# Patient Record
Sex: Male | Born: 1935 | Race: White | Hispanic: No | Marital: Single | Smoking: Former smoker
Health system: Southern US, Community
[De-identification: ages and names within clinical notes are randomized; demographics above are authoritative.]

## PROBLEM LIST (undated history)

## (undated) DIAGNOSIS — M1990 Unspecified osteoarthritis, unspecified site: Secondary | ICD-10-CM

## (undated) DIAGNOSIS — E785 Hyperlipidemia, unspecified: Secondary | ICD-10-CM

## (undated) DIAGNOSIS — E039 Hypothyroidism, unspecified: Secondary | ICD-10-CM

## (undated) DIAGNOSIS — R42 Dizziness and giddiness: Secondary | ICD-10-CM

## (undated) DIAGNOSIS — F0390 Unspecified dementia without behavioral disturbance: Secondary | ICD-10-CM

## (undated) DIAGNOSIS — E079 Disorder of thyroid, unspecified: Secondary | ICD-10-CM

## (undated) HISTORY — PX: APPENDECTOMY: SHX54

---

## 1998-08-15 ENCOUNTER — Encounter: Payer: Self-pay | Admitting: Emergency Medicine

## 1998-08-15 ENCOUNTER — Inpatient Hospital Stay (HOSPITAL_COMMUNITY): Admission: EM | Admit: 1998-08-15 | Discharge: 1998-08-16 | Payer: Self-pay | Admitting: Emergency Medicine

## 1998-08-15 ENCOUNTER — Encounter: Payer: Self-pay | Admitting: Surgery

## 1998-09-07 ENCOUNTER — Observation Stay (HOSPITAL_COMMUNITY): Admission: RE | Admit: 1998-09-07 | Discharge: 1998-09-08 | Payer: Self-pay | Admitting: Surgery

## 1999-08-29 ENCOUNTER — Emergency Department (HOSPITAL_COMMUNITY): Admission: EM | Admit: 1999-08-29 | Discharge: 1999-08-29 | Payer: Self-pay | Admitting: Emergency Medicine

## 1999-08-29 ENCOUNTER — Encounter: Payer: Self-pay | Admitting: Emergency Medicine

## 1999-10-07 ENCOUNTER — Emergency Department (HOSPITAL_COMMUNITY): Admission: EM | Admit: 1999-10-07 | Discharge: 1999-10-08 | Payer: Self-pay | Admitting: Emergency Medicine

## 2000-04-20 ENCOUNTER — Emergency Department (HOSPITAL_COMMUNITY): Admission: EM | Admit: 2000-04-20 | Discharge: 2000-04-20 | Payer: Self-pay | Admitting: Emergency Medicine

## 2000-04-25 ENCOUNTER — Emergency Department (HOSPITAL_COMMUNITY): Admission: EM | Admit: 2000-04-25 | Discharge: 2000-04-25 | Payer: Self-pay | Admitting: Emergency Medicine

## 2000-04-26 ENCOUNTER — Encounter: Payer: Self-pay | Admitting: Emergency Medicine

## 2000-05-05 ENCOUNTER — Emergency Department (HOSPITAL_COMMUNITY): Admission: EM | Admit: 2000-05-05 | Discharge: 2000-05-05 | Payer: Self-pay | Admitting: Emergency Medicine

## 2000-12-29 ENCOUNTER — Encounter: Payer: Self-pay | Admitting: Emergency Medicine

## 2000-12-29 ENCOUNTER — Emergency Department (HOSPITAL_COMMUNITY): Admission: EM | Admit: 2000-12-29 | Discharge: 2000-12-29 | Payer: Self-pay | Admitting: Emergency Medicine

## 2001-05-12 ENCOUNTER — Emergency Department (HOSPITAL_COMMUNITY): Admission: EM | Admit: 2001-05-12 | Discharge: 2001-05-12 | Payer: Self-pay | Admitting: Emergency Medicine

## 2001-11-08 ENCOUNTER — Encounter: Payer: Self-pay | Admitting: Nephrology

## 2001-11-08 ENCOUNTER — Ambulatory Visit (HOSPITAL_COMMUNITY): Admission: RE | Admit: 2001-11-08 | Discharge: 2001-11-08 | Payer: Self-pay | Admitting: Nephrology

## 2002-02-22 ENCOUNTER — Emergency Department (HOSPITAL_COMMUNITY): Admission: EM | Admit: 2002-02-22 | Discharge: 2002-02-22 | Payer: Self-pay | Admitting: Emergency Medicine

## 2002-10-11 ENCOUNTER — Emergency Department (HOSPITAL_COMMUNITY): Admission: EM | Admit: 2002-10-11 | Discharge: 2002-10-12 | Payer: Self-pay | Admitting: Emergency Medicine

## 2002-10-11 ENCOUNTER — Encounter: Payer: Self-pay | Admitting: Emergency Medicine

## 2002-11-05 ENCOUNTER — Emergency Department (HOSPITAL_COMMUNITY): Admission: EM | Admit: 2002-11-05 | Discharge: 2002-11-05 | Payer: Self-pay | Admitting: Emergency Medicine

## 2002-11-05 ENCOUNTER — Encounter: Payer: Self-pay | Admitting: Emergency Medicine

## 2003-10-03 ENCOUNTER — Emergency Department (HOSPITAL_COMMUNITY): Admission: EM | Admit: 2003-10-03 | Discharge: 2003-10-03 | Payer: Self-pay | Admitting: Emergency Medicine

## 2005-03-30 ENCOUNTER — Emergency Department (HOSPITAL_COMMUNITY): Admission: EM | Admit: 2005-03-30 | Discharge: 2005-03-31 | Payer: Self-pay | Admitting: Family Medicine

## 2007-04-30 ENCOUNTER — Emergency Department (HOSPITAL_COMMUNITY): Admission: EM | Admit: 2007-04-30 | Discharge: 2007-04-30 | Payer: Self-pay | Admitting: Emergency Medicine

## 2009-10-24 ENCOUNTER — Ambulatory Visit: Payer: Self-pay | Admitting: Diagnostic Radiology

## 2009-10-24 ENCOUNTER — Emergency Department (HOSPITAL_BASED_OUTPATIENT_CLINIC_OR_DEPARTMENT_OTHER): Admission: EM | Admit: 2009-10-24 | Discharge: 2009-10-24 | Payer: Self-pay | Admitting: Emergency Medicine

## 2010-07-06 ENCOUNTER — Inpatient Hospital Stay (HOSPITAL_COMMUNITY)
Admission: EM | Admit: 2010-07-06 | Discharge: 2010-07-12 | Payer: Self-pay | Source: Home / Self Care | Admitting: Emergency Medicine

## 2010-07-06 ENCOUNTER — Ambulatory Visit: Payer: Self-pay | Admitting: Cardiovascular Disease

## 2010-07-07 ENCOUNTER — Encounter (INDEPENDENT_AMBULATORY_CARE_PROVIDER_SITE_OTHER): Payer: Self-pay | Admitting: Family Medicine

## 2010-07-07 ENCOUNTER — Ambulatory Visit: Payer: Self-pay | Admitting: Vascular Surgery

## 2010-08-21 ENCOUNTER — Ambulatory Visit: Payer: Self-pay | Admitting: Psychiatry

## 2010-12-15 LAB — DIFFERENTIAL
Basophils Absolute: 0 10*3/uL (ref 0.0–0.1)
Eosinophils Relative: 1 % (ref 0–5)
Eosinophils Relative: 2 % (ref 0–5)
Lymphocytes Relative: 20 % (ref 12–46)
Lymphs Abs: 1.7 10*3/uL (ref 0.7–4.0)
Monocytes Absolute: 1 10*3/uL (ref 0.1–1.0)
Monocytes Relative: 12 % (ref 3–12)
Monocytes Relative: 14 % — ABNORMAL HIGH (ref 3–12)
Neutrophils Relative %: 75 % (ref 43–77)

## 2010-12-15 LAB — URINE CULTURE
Colony Count: >=100000
Special Requests: NEGATIVE

## 2010-12-15 LAB — POCT I-STAT, CHEM 8
BUN: 16 mg/dL (ref 6–23)
Calcium, Ion: 1.19 mmol/L (ref 1.12–1.32)
Chloride: 103 mEq/L (ref 96–112)
Creatinine, Ser: 1 mg/dL (ref 0.4–1.5)
Glucose, Bld: 119 mg/dL — ABNORMAL HIGH (ref 70–99)
HCT: 35 % — ABNORMAL LOW (ref 39.0–52.0)
Potassium: 3.8 mEq/L (ref 3.5–5.1)

## 2010-12-15 LAB — CARDIAC PANEL(CRET KIN+CKTOT+MB+TROPI)
CK, MB: 2.4 ng/mL (ref 0.3–4.0)
CK, MB: 2.4 ng/mL (ref 0.3–4.0)
Relative Index: 2 (ref 0.0–2.5)

## 2010-12-15 LAB — POCT CARDIAC MARKERS
CKMB, poc: <1 ng/mL — ABNORMAL LOW (ref 1.0–8.0)
Myoglobin, poc: 64.4 ng/mL (ref 12–200)

## 2010-12-15 LAB — URINALYSIS, MICROSCOPIC ONLY
Glucose, UA: NEGATIVE mg/dL
Leukocytes, UA: NEGATIVE
Nitrite: NEGATIVE
Protein, ur: NEGATIVE mg/dL
pH: 6 (ref 5.0–8.0)

## 2010-12-15 LAB — CBC
HCT: 32.6 % — ABNORMAL LOW (ref 39.0–52.0)
Hemoglobin: 11.1 g/dL — ABNORMAL LOW (ref 13.0–17.0)
MCH: 29.1 pg (ref 26.0–34.0)
MCH: 29.1 pg (ref 26.0–34.0)
MCHC: 34 g/dL (ref 30.0–36.0)
MCHC: 34.2 g/dL (ref 30.0–36.0)
MCV: 85.3 fL (ref 78.0–100.0)
Platelets: 160 10*3/uL (ref 150–400)
WBC: 8.6 10*3/uL (ref 4.0–10.5)

## 2010-12-15 LAB — TROPONIN I: Troponin I: 0.02 ng/mL (ref 0.00–0.06)

## 2010-12-15 LAB — BASIC METABOLIC PANEL
CO2: 25 mEq/L (ref 19–32)
Chloride: 109 mEq/L (ref 96–112)
Glucose, Bld: 95 mg/dL (ref 70–99)
Sodium: 140 mEq/L (ref 135–145)

## 2010-12-20 LAB — CBC
MCHC: 34 g/dL (ref 30.0–36.0)
MCV: 79.2 fL (ref 78.0–100.0)
Platelets: 528 10*3/uL — ABNORMAL HIGH (ref 150–400)
RBC: 3.97 MIL/uL — ABNORMAL LOW (ref 4.22–5.81)
WBC: 6.1 10*3/uL (ref 4.0–10.5)

## 2010-12-20 LAB — POCT B-TYPE NATRIURETIC PEPTIDE (BNP): B Natriuretic Peptide, POC: 76.6 pg/mL (ref 0–100)

## 2010-12-20 LAB — BASIC METABOLIC PANEL
CO2: 27 mEq/L (ref 19–32)
Calcium: 8.9 mg/dL (ref 8.4–10.5)
Creatinine, Ser: 0.9 mg/dL (ref 0.4–1.5)
GFR calc non Af Amer: >60 mL/min (ref >60)
Glucose, Bld: 99 mg/dL (ref 70–99)

## 2010-12-20 LAB — DIFFERENTIAL
Basophils Relative: 1 % (ref 0–1)
Lymphs Abs: 2 10*3/uL (ref 0.7–4.0)
Monocytes Relative: 12 % (ref 3–12)
Neutro Abs: 3.3 10*3/uL (ref 1.7–7.7)
Neutrophils Relative %: 54 % (ref 43–77)

## 2010-12-20 LAB — PROTIME-INR: INR: 1 (ref 0.00–1.49)

## 2010-12-20 LAB — URINALYSIS, ROUTINE W REFLEX MICROSCOPIC
Glucose, UA: NEGATIVE mg/dL
Hgb urine dipstick: NEGATIVE
Protein, ur: NEGATIVE mg/dL

## 2010-12-20 LAB — LIPASE, BLOOD: Lipase: 71 U/L (ref 23–300)

## 2011-05-02 ENCOUNTER — Other Ambulatory Visit: Payer: Self-pay

## 2011-05-02 ENCOUNTER — Emergency Department (INDEPENDENT_AMBULATORY_CARE_PROVIDER_SITE_OTHER): Payer: Medicare Other

## 2011-05-02 ENCOUNTER — Encounter: Payer: Self-pay | Admitting: *Deleted

## 2011-05-02 ENCOUNTER — Emergency Department (HOSPITAL_BASED_OUTPATIENT_CLINIC_OR_DEPARTMENT_OTHER)
Admission: EM | Admit: 2011-05-02 | Discharge: 2011-05-02 | Disposition: A | Discharge status: Home / Self Care | Payer: Medicare Other | Attending: Emergency Medicine | Admitting: Emergency Medicine

## 2011-05-02 DIAGNOSIS — W19XXXA Unspecified fall, initial encounter: Secondary | ICD-10-CM | POA: Insufficient documentation

## 2011-05-02 DIAGNOSIS — R079 Chest pain, unspecified: Secondary | ICD-10-CM | POA: Insufficient documentation

## 2011-05-02 DIAGNOSIS — R42 Dizziness and giddiness: Secondary | ICD-10-CM | POA: Insufficient documentation

## 2011-05-02 DIAGNOSIS — Z8739 Personal history of other diseases of the musculoskeletal system and connective tissue: Secondary | ICD-10-CM | POA: Insufficient documentation

## 2011-05-02 DIAGNOSIS — Z9181 History of falling: Secondary | ICD-10-CM

## 2011-05-02 DIAGNOSIS — M47812 Spondylosis without myelopathy or radiculopathy, cervical region: Secondary | ICD-10-CM

## 2011-05-02 DIAGNOSIS — Y9210 Unspecified residential institution as the place of occurrence of the external cause: Secondary | ICD-10-CM | POA: Insufficient documentation

## 2011-05-02 DIAGNOSIS — M25569 Pain in unspecified knee: Secondary | ICD-10-CM | POA: Insufficient documentation

## 2011-05-02 DIAGNOSIS — M899 Disorder of bone, unspecified: Secondary | ICD-10-CM

## 2011-05-02 DIAGNOSIS — M1710 Unilateral primary osteoarthritis, unspecified knee: Secondary | ICD-10-CM

## 2011-05-02 DIAGNOSIS — F0390 Unspecified dementia without behavioral disturbance: Secondary | ICD-10-CM | POA: Insufficient documentation

## 2011-05-02 DIAGNOSIS — Z79899 Other long term (current) drug therapy: Secondary | ICD-10-CM | POA: Insufficient documentation

## 2011-05-02 HISTORY — DX: Dizziness and giddiness: R42

## 2011-05-02 HISTORY — DX: Unspecified osteoarthritis, unspecified site: M19.90

## 2011-05-02 HISTORY — DX: Unspecified dementia, unspecified severity, without behavioral disturbance, psychotic disturbance, mood disturbance, and anxiety: F03.90

## 2011-05-02 LAB — CBC
MCH: 30.6 pg (ref 26.0–34.0)
MCV: 84.7 fL (ref 78.0–100.0)
Platelets: 225 10*3/uL (ref 150–400)
RDW: 13.6 % (ref 11.5–15.5)

## 2011-05-02 LAB — URINALYSIS, ROUTINE W REFLEX MICROSCOPIC
Leukocytes, UA: NEGATIVE
Nitrite: NEGATIVE
Protein, ur: NEGATIVE mg/dL
Urobilinogen, UA: 0.2 mg/dL (ref 0.0–1.0)

## 2011-05-02 LAB — DIFFERENTIAL
Basophils Absolute: 0 10*3/uL (ref 0.0–0.1)
Eosinophils Absolute: 0.2 10*3/uL (ref 0.0–0.7)
Eosinophils Relative: 2 % (ref 0–5)
Monocytes Absolute: 1.1 10*3/uL — ABNORMAL HIGH (ref 0.1–1.0)

## 2011-05-02 LAB — URINE MICROSCOPIC-ADD ON

## 2011-05-02 NOTE — ED Provider Notes (Addendum)
Scribed for Dr. Ignacia Palma, the patient was seen in room 5. This chart was scribed by Jannette Fogo. This patient's care was started at 18:04.   Chief Complaint  Patient presents with  . Dizziness  . Fall    HPI Comments: Pt is unable to give a review of systems due to dementia.  Corey Miller is a 77 y.o. male with a history of demential was was brought in by ambulance to the Emergency Department from assisted living facility for dizziness and fall. Patient states he felt "swimmy headed" while in the elevator, "slid down" and fell. He was found on the floor in the elevator by the nursing home staff. Patient complains of head pain, chest pain, and knee pain after the fall. States knee pain is exacerbated by movement of the extremities. He denies any associated vomiting. Patient has a history of falls in the past with the last episode being 4-5 days ago while at a picnic with family. History and exam is limited by dementia.    Past Medical History  Diagnosis Date  . Arthritis   . Dementia   . Vertigo     PAST SURGICAL HISTORY:  Appendectomy   MEDICATIONS:  Previous Medications   CHOLECALCIFEROL (VITAMIN D) 1000 UNITS CAPSULE    Take 1,000 Units by mouth daily.     DEXTROMETHORPHAN-GUAIFENESIN (TUSSIN DM) 10-100 MG/5ML LIQUID    Take 10 mLs by mouth every 4 (four) hours as needed. cough    DONEPEZIL (ARICEPT) 10 MG TABLET    Take 10 mg by mouth daily.     FERROUS SULFATE 325 (65 FE) MG TABLET    Take 325 mg by mouth daily with breakfast.     LEVOTHYROXINE (SYNTHROID, LEVOTHROID) 50 MCG TABLET    Take 50 mcg by mouth daily.     MECLIZINE (ANTIVERT) 12.5 MG TABLET    Take 12.5 mg by mouth as needed. vertigo    OMEPRAZOLE (PRILOSEC) 20 MG CAPSULE    Take 20 mg by mouth daily.     PROMETHAZINE (PHENERGAN) 25 MG TABLET    Take 25 mg by mouth every 6 (six) hours as needed. Nausea and vomiting    TEMAZEPAM (RESTORIL) 15 MG CAPSULE    Take 15 mg by mouth at bedtime as needed. sleep       ALLERGIES:  Allergies as of 05/02/2011  . (No Known Allergies)    FAMILY HISTORY:  No Pertinent Family History  SOCIAL HISTORY: Brought in by ambulance Lives at assisted living facility  Accompanied to the ED by family: sister  History  Substance Use Topics  . Smoking status: Never Smoker   . Smokeless tobacco: Not on file  . Alcohol Use: No    Review of Systems  Unable to perform ROS    Physical Exam  BP 134/74  Pulse 81  Temp(Src) 98.2 F (36.8 C) (Oral)  Resp 20  SpO2 98%   Physical Exam  Constitutional: He appears well-developed and well-nourished.       Elderly, hard of hearing male  HENT:  Head: Normocephalic and atraumatic.  Mouth/Throat: Oropharynx is clear and moist.  Eyes: Conjunctivae are normal. Pupils are equal, round, and reactive to light.  Neck: Neck supple.       Non-tender.   Cardiovascular: Normal rate and regular rhythm.   No murmur heard. Pulmonary/Chest: Effort normal and breath sounds normal.       Localized pain to the anterior chest but no deformity noted.   Abdominal: Soft.  Bowel sounds are normal. He exhibits no distension. There is no tenderness.  Musculoskeletal: Normal range of motion. He exhibits no edema and no tenderness.       Back non-tender. Winces in pain with movement of bilateral knees.  Neurological: He is alert. Coordination normal.       Grossly intact.   Skin: Skin is warm and dry. No rash noted.     OTHER DATA REVIEWED: Nursing notes, vital signs, and past medical records reviewed.   DIAGNOSTIC STUDIES: Oxygen Saturation is 98% on [place], normal by my interpretation.     LABS / RADIOLOGY:  Results for orders placed during the hospital encounter of 05/02/11  URINALYSIS, ROUTINE W REFLEX MICROSCOPIC      Component Value Range   Color, Urine YELLOW  YELLOW    Appearance CLEAR  CLEAR    Specific Gravity, Urine 1.016  1.005 - 1.030    pH 5.5  5.0 - 8.0    Glucose, UA NEGATIVE  NEGATIVE (mg/dL)   Hgb urine  dipstick TRACE (*) NEGATIVE    Bilirubin Urine NEGATIVE  NEGATIVE    Ketones, ur NEGATIVE  NEGATIVE (mg/dL)   Protein, ur NEGATIVE  NEGATIVE (mg/dL)   Urobilinogen, UA 0.2  0.0 - 1.0 (mg/dL)   Nitrite NEGATIVE  NEGATIVE    Leukocytes, UA NEGATIVE  NEGATIVE   URINE MICROSCOPIC-ADD ON      Component Value Range   Squamous Epithelial / LPF RARE  RARE    RBC / HPF 0-2  <3 (RBC/hpf)   Bacteria, UA RARE  RARE   CBC      Component Value Range   WBC 9.6  4.0 - 10.5 (K/uL)   RBC 4.45  4.22 - 5.81 (MIL/uL)   Hemoglobin 13.6  13.0 - 17.0 (g/dL)   HCT 46.9 (*) 62.9 - 52.0 (%)   MCV 84.7  78.0 - 100.0 (fL)   MCH 30.6  26.0 - 34.0 (pg)   MCHC 36.1 (*) 30.0 - 36.0 (g/dL)   RDW 52.8  41.3 - 24.4 (%)   Platelets 225  150 - 400 (K/uL)  DIFFERENTIAL      Component Value Range   Neutrophils Relative 61  43 - 77 (%)   Neutro Abs 5.9  1.7 - 7.7 (K/uL)   Lymphocytes Relative 25  12 - 46 (%)   Lymphs Abs 2.4  0.7 - 4.0 (K/uL)   Monocytes Relative 12  3 - 12 (%)   Monocytes Absolute 1.1 (*) 0.1 - 1.0 (K/uL)   Eosinophils Relative 2  0 - 5 (%)   Eosinophils Absolute 0.2  0.0 - 0.7 (K/uL)   Basophils Relative 0  0 - 1 (%)   Basophils Absolute 0.0  0.0 - 0.1 (K/uL)    CXR: 2 View; Interpreted by Radiologist Dr. Danae Orleans and reviewed by me: Stable low lung volumes and pulmonary interstitial prominence. No acute findings.   XRAY Left Knee: 1-2 View; Interpreted by Radiologist Dr. Danae Orleans and reviewed by me: 1. No acute findings. 2. Mild patellar degenerative spurring.   XRAY Right Knee: 1-2 View;  Interpreted by Radiologist Dr. Danae Orleans and reviewed by me: 1. No acute findings. 2. Osteoarthritis.   CT C-Spine: Interpreted by Radiologist Dr.WILLIAM B. VEAZEY 1. No evidence of acute cervical spine fracture, subluxation or static signs of instability. 2. Multilevel spondylosis with ankylosis as described.   CT Head: Interpreted by Radiologist Dr.WILLIAM B. VEAZEY Stable mild  chronic small vessel ischemic changes.  No acute intracranial findings.   ED COURSE / COORDINATION OF CARE: 8:12 PM - ED physician discussed results with the patient and family. Orthostatic vital signs and gait test ordered.   Patient ambulatory while in the ED and is stable for discharge.      I personally performed the services described in this documentation, which was scribed in my presence. The recorded information has been reviewed and considered. No att. providers found   IMPRESSION: Diagnoses that have been ruled out:  Diagnoses that are still under consideration:  Final diagnoses:  History of fall     PLAN: Discharge  The patient is to return the emergency department if there is any worsening of symptoms. I have reviewed the discharge instructions with the patient and family.    CONDITION ON DISCHARGE: Stable    MEDICATIONS GIVEN IN THE E.D.  Medications  donepezil (ARICEPT) 10 MG tablet (not administered)  ferrous sulfate 325 (65 FE) MG tablet (not administered)  levothyroxine (SYNTHROID, LEVOTHROID) 50 MCG tablet (not administered)  omeprazole (PRILOSEC) 20 MG capsule (not administered)  Cholecalciferol (VITAMIN D) 1000 UNITS capsule (not administered)  meclizine (ANTIVERT) 12.5 MG tablet (not administered)  promethazine (PHENERGAN) 25 MG tablet (not administered)  temazepam (RESTORIL) 15 MG capsule (not administered)  Dextromethorphan-Guaifenesin (TUSSIN DM) 10-100 MG/5ML liquid (not administered)     DISCHARGE MEDICATIONS: New Prescriptions   No medications on file     Procedures     Course in the ED: Patient had a physical exam laboratory and x-ray testing. His workup was benign. Orthostatic vital signs were normal. The patient was able to walk without much difficulty. He was therefore released to his facility, to continue his regular medications.  I personally performed the services described in this documentation, which was scribed in my  presence. The recorded information has been reviewed and considered.  Osvaldo Human, M.D.    Carleene Hartsock III, MD 05/03/11 1126  Carleene Proia III, MD 06/10/11 1118  Carleene Knoop III, MD 06/10/11 1124

## 2011-05-02 NOTE — ED Notes (Signed)
Pt to be discharged via [facility] to [facility].  [facility] notified

## 2011-05-02 NOTE — ED Notes (Signed)
Pt to room 5 via stretcher by ems.  Per report from [facility], pt was found on the floor in the elevator stating that he felt "swimmy headed".  Pt is now a/a/o to baseline, per ems pt is pleasantly confused at baseline.  Pt denies feeling "swimmy headed" at this time.

## 2011-05-02 NOTE — ED Provider Notes (Signed)
History     Chief Complaint  Patient presents with  . Dizziness  . Fall   HPI  Past Medical History  Diagnosis Date  . Arthritis   . Dementia   . Vertigo     History reviewed. No pertinent past surgical history.  History reviewed. No pertinent family history.  History  Substance Use Topics  . Smoking status: Never Smoker   . Smokeless tobacco: Not on file  . Alcohol Use: No      Review of Systems  Physical Exam  BP 134/74  Pulse 81  Temp(Src) 98.2 F (36.8 C) (Oral)  Resp 20  SpO2 98%  Physical Exam  ED Course  Procedures  MDM     Date: 05/02/2011  Rate: 78  Rhythm: normal sinus rhythm  QRS Axis: normal  Intervals: normal  ST/T Wave abnormalities: normal  Conduction Disutrbances:none  Narrative Interpretation: Normal EKG  Old EKG Reviewed: unchanged        Carleene Emory III, MD 05/03/11 1124

## 2011-05-02 NOTE — ED Notes (Signed)
[facility] here for transport to assisted living.

## 2011-05-03 ENCOUNTER — Emergency Department (HOSPITAL_COMMUNITY): Payer: Medicare Other

## 2011-05-03 ENCOUNTER — Emergency Department (HOSPITAL_COMMUNITY)
Admission: EM | Admit: 2011-05-03 | Discharge: 2011-05-03 | Disposition: A | Discharge status: Home / Self Care | Payer: Medicare Other | Attending: Emergency Medicine | Admitting: Emergency Medicine

## 2011-05-03 ENCOUNTER — Encounter (HOSPITAL_COMMUNITY): Payer: Self-pay | Admitting: Radiology

## 2011-05-03 DIAGNOSIS — R112 Nausea with vomiting, unspecified: Secondary | ICD-10-CM | POA: Insufficient documentation

## 2011-05-03 DIAGNOSIS — M129 Arthropathy, unspecified: Secondary | ICD-10-CM | POA: Insufficient documentation

## 2011-05-03 DIAGNOSIS — R42 Dizziness and giddiness: Secondary | ICD-10-CM | POA: Insufficient documentation

## 2011-05-03 DIAGNOSIS — R109 Unspecified abdominal pain: Secondary | ICD-10-CM | POA: Insufficient documentation

## 2011-05-03 DIAGNOSIS — F068 Other specified mental disorders due to known physiological condition: Secondary | ICD-10-CM | POA: Insufficient documentation

## 2011-05-03 DIAGNOSIS — Z9181 History of falling: Secondary | ICD-10-CM | POA: Insufficient documentation

## 2011-05-03 LAB — DIFFERENTIAL
Basophils Absolute: 0 10*3/uL (ref 0.0–0.1)
Basophils Relative: 0 % (ref 0–1)
Eosinophils Absolute: 0 10*3/uL (ref 0.0–0.7)
Monocytes Absolute: 0.5 10*3/uL (ref 0.1–1.0)
Neutro Abs: 9.3 10*3/uL — ABNORMAL HIGH (ref 1.7–7.7)

## 2011-05-03 LAB — CBC
Hemoglobin: 14.1 g/dL (ref 13.0–17.0)
MCH: 29.4 pg (ref 26.0–34.0)
MCHC: 34.6 g/dL (ref 30.0–36.0)
Platelets: 219 10*3/uL (ref 150–400)
RDW: 13 % (ref 11.5–15.5)

## 2011-05-03 LAB — BASIC METABOLIC PANEL
Calcium: 10.1 mg/dL (ref 8.4–10.5)
Creatinine, Ser: 0.77 mg/dL (ref 0.50–1.35)
GFR calc non Af Amer: >60 mL/min (ref >60)
Glucose, Bld: 130 mg/dL — ABNORMAL HIGH (ref 70–99)
Sodium: 138 mEq/L (ref 135–145)

## 2011-05-03 MED ORDER — IOHEXOL 300 MG/ML  SOLN
100.0000 mL | Freq: Once | INTRAMUSCULAR | Status: AC | PRN
  Administered 2011-05-03: 100 mL via INTRAVENOUS

## 2011-07-18 LAB — CBC
MCV: 85.6
Platelets: 332
RBC: 4.68
WBC: 10.2

## 2011-07-18 LAB — DIFFERENTIAL
Basophils Absolute: 0.1
Eosinophils Absolute: 0
Eosinophils Relative: 0
Lymphocytes Relative: 11 — ABNORMAL LOW
Monocytes Absolute: 0.6

## 2011-07-18 LAB — URINALYSIS, ROUTINE W REFLEX MICROSCOPIC
Bilirubin Urine: NEGATIVE
Ketones, ur: 15 — AB
Nitrite: NEGATIVE
Protein, ur: NEGATIVE
pH: 6.5

## 2011-07-18 LAB — COMPREHENSIVE METABOLIC PANEL
ALT: 12
AST: 17
Albumin: 3.7
Alkaline Phosphatase: 94
CO2: 22
Chloride: 104
GFR calc Af Amer: >60
GFR calc non Af Amer: >60
Potassium: 4.2
Sodium: 137
Total Bilirubin: 1.3 — ABNORMAL HIGH

## 2011-07-18 LAB — URINE MICROSCOPIC-ADD ON

## 2012-10-28 ENCOUNTER — Emergency Department (HOSPITAL_COMMUNITY)
Admission: EM | Admit: 2012-10-28 | Discharge: 2012-10-28 | Disposition: A | Discharge status: Skilled Nursing Facility | Payer: Medicare Other | Attending: Emergency Medicine | Admitting: Emergency Medicine

## 2012-10-28 ENCOUNTER — Encounter (HOSPITAL_COMMUNITY): Payer: Self-pay | Admitting: Emergency Medicine

## 2012-10-28 DIAGNOSIS — E079 Disorder of thyroid, unspecified: Secondary | ICD-10-CM | POA: Insufficient documentation

## 2012-10-28 DIAGNOSIS — Z79899 Other long term (current) drug therapy: Secondary | ICD-10-CM | POA: Insufficient documentation

## 2012-10-28 DIAGNOSIS — M129 Arthropathy, unspecified: Secondary | ICD-10-CM | POA: Insufficient documentation

## 2012-10-28 DIAGNOSIS — F0390 Unspecified dementia without behavioral disturbance: Secondary | ICD-10-CM | POA: Insufficient documentation

## 2012-10-28 DIAGNOSIS — S0993XA Unspecified injury of face, initial encounter: Secondary | ICD-10-CM | POA: Insufficient documentation

## 2012-10-28 DIAGNOSIS — W19XXXA Unspecified fall, initial encounter: Secondary | ICD-10-CM | POA: Insufficient documentation

## 2012-10-28 DIAGNOSIS — Z8669 Personal history of other diseases of the nervous system and sense organs: Secondary | ICD-10-CM | POA: Insufficient documentation

## 2012-10-28 DIAGNOSIS — Y9341 Activity, dancing: Secondary | ICD-10-CM | POA: Insufficient documentation

## 2012-10-28 DIAGNOSIS — Y9210 Unspecified residential institution as the place of occurrence of the external cause: Secondary | ICD-10-CM | POA: Insufficient documentation

## 2012-10-28 HISTORY — DX: Disorder of thyroid, unspecified: E07.9

## 2012-10-28 NOTE — Discharge Instructions (Signed)
There is no current evidence of your fall resulting in significant trauma.  However, if you develop any new, or concerning changes in your condition, please make sure to return here for additional evaluation.

## 2012-10-28 NOTE — ED Notes (Signed)
Pt from [facility] via EMS after "dancing because it was shower night ", lost balance and fell. Pt denies any injury. Pt to be evaluated per facility policy.

## 2012-10-28 NOTE — ED Notes (Signed)
[facility] contacted to transport pt back to [facility]

## 2012-10-28 NOTE — ED Notes (Signed)
WUJ:[id number]<JY> Expected date:10/28/12<BR> Expected time: 5:25 PM<BR> Means of arrival:Ambulance<BR> Comments:<BR> Fall, no injury

## 2012-10-28 NOTE — ED Provider Notes (Signed)
History     CSN: [id number]  Arrival date & time 10/28/12  1729   First MD Initiated Contact with Patient 10/28/12 1735      Chief Complaint  Patient presents with  . Fall     HPI  This pleasant, though demented male presents after a fall.  He is a resident at a nursing facility that has a policy of all resonance requiring emergency room evaluation following a fall.  He states that he lost his balance, slipped, his head.  He denies loss of consciousness, any subsequent pain anywhere, including his head, his neck, his chest, his hips.  She remains ambulatory, with no new weakness anywhere.  He also denies any visual changes, nausea, vomiting. Per report there was no loss of consciousness, no vomiting, no bleeding on the scene.  Past Medical History  Diagnosis Date  . Arthritis   . Dementia   . Vertigo   . Thyroid disease     Past Surgical History  Procedure Date  . Appendectomy     No family history on file.  History  Substance Use Topics  . Smoking status: Never Smoker   . Smokeless tobacco: Not on file  . Alcohol Use: No      Review of Systems  All other systems reviewed and are negative.    Allergies  Review of patient's allergies indicates no known allergies.  Home Medications   Current Outpatient Rx  Name  Route  Sig  Dispense  Refill  . ATORVASTATIN CALCIUM 10 MG PO TABS   Oral   Take 10 mg by mouth daily.         . CELECOXIB 200 MG PO CAPS   Oral   Take 200 mg by mouth daily with breakfast.         . CHOLECALCIFEROL 400 UNITS PO TABS   Oral   Take 400 Units by mouth daily.         . DONEPEZIL HCL 10 MG PO TABS   Oral   Take 10 mg by mouth at bedtime.          Marland Kitchen FERROUS SULFATE 325 (65 FE) MG PO TABS   Oral   Take 325 mg by mouth daily with breakfast.           . FESOTERODINE FUMARATE ER 8 MG PO TB24   Oral   Take 8 mg by mouth every morning.         Marland Kitchen LEVOTHYROXINE SODIUM 125 MCG PO TABS   Oral   Take 125 mcg by mouth  every morning.         Marland Kitchen LORAZEPAM 0.5 MG PO TABS   Oral   Take 0.25 mg by mouth every morning. For anxiety and aggegation         . MENTHOL-ZINC OXIDE 0.44-20.625 % EX OINT   Apply externally   Apply 1 application topically 2 (two) times daily. Apply a thin layer to red areas on peri area and upper thighs         . NYSTATIN 100000 UNIT/GM EX POWD   Topical   Apply 60 g topically 2 (two) times daily. Apply to rash under arms until healed         . OMEPRAZOLE 20 MG PO CPDR   Oral   Take 20 mg by mouth daily.             BP 125/79  Pulse 78  Temp 98.1 F (36.7 C) (Oral)  Resp 16  SpO2 93%  Physical Exam  Nursing note and vitals reviewed. Constitutional: He is oriented to person, place, and time. He appears well-developed. No distress.  HENT:  Head: Normocephalic and atraumatic. Head is without abrasion and without contusion.    Nose: Nose normal.  Mouth/Throat: Uvula is midline, oropharynx is clear and moist and mucous membranes are normal.  Eyes: Conjunctivae normal and EOM are normal.  Neck: Full passive range of motion without pain. Neck supple. No spinous process tenderness and no muscular tenderness present. No rigidity. No edema present.  Cardiovascular: Normal rate and regular rhythm.   Pulmonary/Chest: Effort normal. No stridor. No respiratory distress.  Abdominal: He exhibits no distension.  Musculoskeletal: He exhibits no edema.       No focal deformities, and the patient denies any complaints.    Neurological: He is alert and oriented to person, place, and time. He displays no atrophy and no tremor. He exhibits normal muscle tone. He displays no seizure activity.       Patient can stand, ambulate w no difficulty  Skin: Skin is warm and dry.  Psychiatric: He has a normal mood and affect.    ED Course  Procedures (including critical care time)  Labs Reviewed - No data to display No results found.   1. Fall       MDM  This pleasant,  laughing elderly male presents after a mechanical fall.  On exam the patient is in no distress, denying any ongoing complaints.  The absence of loss of consciousness, the absence of blood thinning medication, and the patient's preserved neurologic function now is all reassuring.  Emergent imaging was not indicated.  The patient was also capable ambulance, any further reassurance for the low suspicion of acute ongoing intracranial pathology.     Gerhard Munch, MD 10/28/12 [phone]

## 2014-05-01 ENCOUNTER — Emergency Department (HOSPITAL_COMMUNITY): Payer: Medicare Other

## 2014-05-01 ENCOUNTER — Inpatient Hospital Stay (HOSPITAL_COMMUNITY)
Admission: EM | Admit: 2014-05-01 | Discharge: 2014-05-03 | DRG: 603 | Disposition: A | Discharge status: Skilled Nursing Facility | Payer: Medicare Other | Attending: Internal Medicine | Admitting: Internal Medicine

## 2014-05-01 ENCOUNTER — Observation Stay (HOSPITAL_COMMUNITY): Payer: Medicare Other

## 2014-05-01 ENCOUNTER — Encounter (HOSPITAL_COMMUNITY): Payer: Self-pay | Admitting: Emergency Medicine

## 2014-05-01 DIAGNOSIS — M129 Arthropathy, unspecified: Secondary | ICD-10-CM | POA: Diagnosis present

## 2014-05-01 DIAGNOSIS — L02519 Cutaneous abscess of unspecified hand: Principal | ICD-10-CM | POA: Diagnosis present

## 2014-05-01 DIAGNOSIS — F0390 Unspecified dementia without behavioral disturbance: Secondary | ICD-10-CM | POA: Diagnosis present

## 2014-05-01 DIAGNOSIS — R1310 Dysphagia, unspecified: Secondary | ICD-10-CM | POA: Diagnosis present

## 2014-05-01 DIAGNOSIS — R471 Dysarthria and anarthria: Secondary | ICD-10-CM | POA: Diagnosis present

## 2014-05-01 DIAGNOSIS — E079 Disorder of thyroid, unspecified: Secondary | ICD-10-CM | POA: Diagnosis present

## 2014-05-01 DIAGNOSIS — L0390 Cellulitis, unspecified: Secondary | ICD-10-CM | POA: Diagnosis present

## 2014-05-01 DIAGNOSIS — L0291 Cutaneous abscess, unspecified: Secondary | ICD-10-CM

## 2014-05-01 DIAGNOSIS — L03119 Cellulitis of unspecified part of limb: Secondary | ICD-10-CM | POA: Diagnosis present

## 2014-05-01 DIAGNOSIS — F0391 Unspecified dementia with behavioral disturbance: Secondary | ICD-10-CM | POA: Diagnosis present

## 2014-05-01 DIAGNOSIS — R4789 Other speech disturbances: Secondary | ICD-10-CM

## 2014-05-01 DIAGNOSIS — R42 Dizziness and giddiness: Secondary | ICD-10-CM | POA: Diagnosis present

## 2014-05-01 DIAGNOSIS — F03918 Unspecified dementia, unspecified severity, with other behavioral disturbance: Secondary | ICD-10-CM | POA: Diagnosis present

## 2014-05-01 DIAGNOSIS — Z79899 Other long term (current) drug therapy: Secondary | ICD-10-CM

## 2014-05-01 LAB — CBC WITH DIFFERENTIAL/PLATELET
BASOS ABS: 0 10*3/uL (ref 0.0–0.1)
BASOS PCT: 0 % (ref 0–1)
EOS ABS: 0 10*3/uL (ref 0.0–0.7)
EOS PCT: 0 % (ref 0–5)
HEMATOCRIT: 34.4 % — AB (ref 39.0–52.0)
Hemoglobin: 12.1 g/dL — ABNORMAL LOW (ref 13.0–17.0)
LYMPHS PCT: 13 % (ref 12–46)
Lymphs Abs: 1.5 10*3/uL (ref 0.7–4.0)
MCH: 30.9 pg (ref 26.0–34.0)
MCHC: 35.2 g/dL (ref 30.0–36.0)
MCV: 88 fL (ref 78.0–100.0)
MONO ABS: 1.8 10*3/uL — AB (ref 0.1–1.0)
Monocytes Relative: 15 % — ABNORMAL HIGH (ref 3–12)
Neutro Abs: 8.6 10*3/uL — ABNORMAL HIGH (ref 1.7–7.7)
Neutrophils Relative %: 72 % (ref 43–77)
PLATELETS: 151 10*3/uL (ref 150–400)
RBC: 3.91 MIL/uL — ABNORMAL LOW (ref 4.22–5.81)
RDW: 13.2 % (ref 11.5–15.5)
WBC: 11.9 10*3/uL — AB (ref 4.0–10.5)

## 2014-05-01 LAB — URINALYSIS, ROUTINE W REFLEX MICROSCOPIC
Bilirubin Urine: NEGATIVE
GLUCOSE, UA: NEGATIVE mg/dL
KETONES UR: NEGATIVE mg/dL
LEUKOCYTES UA: NEGATIVE
Nitrite: NEGATIVE
PROTEIN: NEGATIVE mg/dL
Specific Gravity, Urine: 1.012 (ref 1.005–1.030)
UROBILINOGEN UA: 2 mg/dL — AB (ref 0.0–1.0)
pH: 6.5 (ref 5.0–8.0)

## 2014-05-01 LAB — I-STAT CHEM 8, ED
BUN: 10 mg/dL (ref 6–23)
CALCIUM ION: 1.14 mmol/L (ref 1.13–1.30)
Chloride: 103 mEq/L (ref 96–112)
Creatinine, Ser: 0.8 mg/dL (ref 0.50–1.35)
Glucose, Bld: 112 mg/dL — ABNORMAL HIGH (ref 70–99)
HEMATOCRIT: 36 % — AB (ref 39.0–52.0)
HEMOGLOBIN: 12.2 g/dL — AB (ref 13.0–17.0)
Potassium: 4.4 mEq/L (ref 3.7–5.3)
SODIUM: 135 meq/L — AB (ref 137–147)
TCO2: 27 mmol/L (ref 0–100)

## 2014-05-01 LAB — URINE MICROSCOPIC-ADD ON

## 2014-05-01 LAB — MRSA PCR SCREENING: MRSA by PCR: NEGATIVE

## 2014-05-01 MED ORDER — KETOROLAC TROMETHAMINE 30 MG/ML IJ SOLN
30.0000 mg | Freq: Once | INTRAMUSCULAR | Status: AC
  Administered 2014-05-01: 30 mg via INTRAVENOUS
  Filled 2014-05-01: qty 1

## 2014-05-01 MED ORDER — PAROXETINE HCL 20 MG PO TABS
40.0000 mg | ORAL_TABLET | Freq: Every day | ORAL | Status: DC
  Administered 2014-05-01 – 2014-05-02 (×2): 40 mg via ORAL
  Filled 2014-05-01 (×3): qty 2

## 2014-05-01 MED ORDER — SERTRALINE HCL 50 MG PO TABS
50.0000 mg | ORAL_TABLET | Freq: Every day | ORAL | Status: DC
  Administered 2014-05-01 – 2014-05-03 (×3): 50 mg via ORAL
  Filled 2014-05-01 (×3): qty 1

## 2014-05-01 MED ORDER — LACTULOSE 10 GM/15ML PO SOLN
6.6700 g | Freq: Two times a day (BID) | ORAL | Status: DC
  Administered 2014-05-01 (×2): 6.67 g via ORAL
  Administered 2014-05-02: 10:00:00 via ORAL
  Administered 2014-05-02 – 2014-05-03 (×2): 6.67 g via ORAL
  Filled 2014-05-01 (×6): qty 15

## 2014-05-01 MED ORDER — CHOLECALCIFEROL 10 MCG (400 UNIT) PO TABS
400.0000 [IU] | ORAL_TABLET | Freq: Every day | ORAL | Status: DC
  Administered 2014-05-01 – 2014-05-03 (×3): 400 [IU] via ORAL
  Filled 2014-05-01 (×3): qty 1

## 2014-05-01 MED ORDER — LORAZEPAM 0.5 MG PO TABS
0.2500 mg | ORAL_TABLET | Freq: Every morning | ORAL | Status: DC
  Administered 2014-05-01: 0.25 mg via ORAL
  Administered 2014-05-02: 10:00:00 via ORAL
  Administered 2014-05-03: 0.25 mg via ORAL
  Filled 2014-05-01 (×3): qty 1

## 2014-05-01 MED ORDER — VANCOMYCIN HCL 500 MG IV SOLR
500.0000 mg | Freq: Two times a day (BID) | INTRAVENOUS | Status: DC
  Filled 2014-05-01: qty 500

## 2014-05-01 MED ORDER — VANCOMYCIN HCL IN DEXTROSE 1-5 GM/200ML-% IV SOLN
1000.0000 mg | Freq: Once | INTRAVENOUS | Status: AC
  Administered 2014-05-01: 1000 mg via INTRAVENOUS
  Filled 2014-05-01: qty 200

## 2014-05-01 MED ORDER — NYSTATIN 100000 UNIT/GM EX POWD
Freq: Two times a day (BID) | CUTANEOUS | Status: DC
  Administered 2014-05-01 – 2014-05-03 (×5): via TOPICAL
  Filled 2014-05-01: qty 15

## 2014-05-01 MED ORDER — LEVOTHYROXINE SODIUM 125 MCG PO TABS
125.0000 ug | ORAL_TABLET | Freq: Every day | ORAL | Status: DC
  Administered 2014-05-01 – 2014-05-03 (×3): 125 ug via ORAL
  Filled 2014-05-01 (×4): qty 1

## 2014-05-01 MED ORDER — DONEPEZIL HCL 10 MG PO TABS
10.0000 mg | ORAL_TABLET | Freq: Every day | ORAL | Status: DC
  Administered 2014-05-01 – 2014-05-02 (×2): 10 mg via ORAL
  Filled 2014-05-01 (×3): qty 1

## 2014-05-01 MED ORDER — RISPERIDONE 1 MG PO TABS
1.0000 mg | ORAL_TABLET | Freq: Two times a day (BID) | ORAL | Status: DC
  Administered 2014-05-01 – 2014-05-03 (×5): 1 mg via ORAL
  Filled 2014-05-01 (×6): qty 1

## 2014-05-01 MED ORDER — SODIUM CHLORIDE 0.9 % IJ SOLN
3.0000 mL | Freq: Two times a day (BID) | INTRAMUSCULAR | Status: DC
  Administered 2014-05-01 – 2014-05-03 (×2): 3 mL via INTRAVENOUS

## 2014-05-01 MED ORDER — MELATONIN 5 MG PO TABS: 1.0000 | ORAL_TABLET | Freq: Every day | ORAL | Status: DC

## 2014-05-01 MED ORDER — HEPARIN SODIUM (PORCINE) 5000 UNIT/ML IJ SOLN
5000.0000 [IU] | Freq: Three times a day (TID) | INTRAMUSCULAR | Status: DC
  Administered 2014-05-01 – 2014-05-03 (×7): 5000 [IU] via SUBCUTANEOUS
  Filled 2014-05-01 (×8): qty 1

## 2014-05-01 MED ORDER — ATORVASTATIN CALCIUM 10 MG PO TABS
5.0000 mg | ORAL_TABLET | Freq: Every day | ORAL | Status: DC
  Administered 2014-05-01 – 2014-05-02 (×2): 5 mg via ORAL
  Filled 2014-05-01 (×3): qty 0.5

## 2014-05-01 MED ORDER — NYSTATIN 100000 UNIT/GM EX POWD: 60.0000 g | Freq: Two times a day (BID) | CUTANEOUS | Status: DC

## 2014-05-01 MED ORDER — PIPERACILLIN-TAZOBACTAM 3.375 G IVPB
3.3750 g | Freq: Three times a day (TID) | INTRAVENOUS | Status: DC
  Administered 2014-05-01 – 2014-05-02 (×3): 3.375 g via INTRAVENOUS
  Filled 2014-05-01 (×6): qty 50

## 2014-05-01 MED ORDER — FERROUS SULFATE 325 (65 FE) MG PO TABS
325.0000 mg | ORAL_TABLET | Freq: Every day | ORAL | Status: DC
  Administered 2014-05-01 – 2014-05-03 (×3): 325 mg via ORAL
  Filled 2014-05-01 (×4): qty 1

## 2014-05-01 MED ORDER — PIPERACILLIN-TAZOBACTAM 3.375 G IVPB 30 MIN
3.3750 g | Freq: Once | INTRAVENOUS | Status: AC
  Administered 2014-05-01: 3.375 g via INTRAVENOUS
  Filled 2014-05-01: qty 50

## 2014-05-01 MED ORDER — VANCOMYCIN HCL IN DEXTROSE 750-5 MG/150ML-% IV SOLN
750.0000 mg | Freq: Two times a day (BID) | INTRAVENOUS | Status: DC
  Administered 2014-05-01 – 2014-05-03 (×4): 750 mg via INTRAVENOUS
  Filled 2014-05-01 (×6): qty 150

## 2014-05-01 MED ORDER — PANTOPRAZOLE SODIUM 40 MG PO TBEC
40.0000 mg | DELAYED_RELEASE_TABLET | Freq: Every day | ORAL | Status: DC
  Administered 2014-05-01 – 2014-05-03 (×3): 40 mg via ORAL
  Filled 2014-05-01 (×3): qty 1

## 2014-05-01 MED ORDER — FESOTERODINE FUMARATE ER 8 MG PO TB24
8.0000 mg | ORAL_TABLET | Freq: Every day | ORAL | Status: DC
  Administered 2014-05-01 – 2014-05-03 (×3): 8 mg via ORAL
  Filled 2014-05-01 (×3): qty 1

## 2014-05-01 NOTE — ED Provider Notes (Signed)
CSN: [id number]     Arrival date & time 05/01/14  0121 History   First MD Initiated Contact with Patient 05/01/14 0147     Chief Complaint  Patient presents with  . Hand Pain  . Aphasia     (Consider location/radiation/quality/duration/timing/severity/associated sxs/prior Treatment) Patient is a 80 y.o. male presenting with hand pain. The history is provided by the EMS personnel. The history is limited by the condition of the patient (dementia). No language interpreter was used.  Hand Pain This is a new problem. Episode onset: unable. The problem occurs constantly. The problem has not changed since onset.Pertinent negatives include no abdominal pain. Nothing aggravates the symptoms. Nothing relieves the symptoms. He has tried nothing for the symptoms. The treatment provided no relief.  And also having lip smacking with funny noise between smacking of lips.    Past Medical History  Diagnosis Date  . Arthritis   . Dementia   . Vertigo   . Thyroid disease    Past Surgical History  Procedure Laterality Date  . Appendectomy     History reviewed. No pertinent family history. History  Substance Use Topics  . Smoking status: Never Smoker   . Smokeless tobacco: Not on file  . Alcohol Use: No    Review of Systems  Unable to perform ROS Gastrointestinal: Negative for abdominal pain.      Allergies  Review of patient's allergies indicates no known allergies.  Home Medications   Prior to Admission medications   Medication Sig Start Date End Date Taking? Authorizing Provider  atorvastatin (LIPITOR) 10 MG tablet Take 5 mg by mouth at bedtime.   Yes Historical Provider, MD  cholecalciferol (VITAMIN D) 400 UNITS TABS Take 400 Units by mouth daily.   Yes Historical Provider, MD  donepezil (ARICEPT) 10 MG tablet Take 10 mg by mouth at bedtime.    Yes Historical Provider, MD  ferrous sulfate 325 (65 FE) MG tablet Take 325 mg by mouth daily with breakfast.     Yes Historical Provider,  MD  fesoterodine (TOVIAZ) 8 MG TB24 Take 8 mg by mouth daily.    Yes Historical Provider, MD  lactulose (CHRONULAC) 10 GM/15ML solution Take 6.67 g by mouth 2 (two) times daily. Dose is9ml   Yes Historical Provider, MD  levothyroxine (SYNTHROID, LEVOTHROID) 125 MCG tablet Take 125 mcg by mouth every morning.   Yes Historical Provider, MD  Melatonin 5 MG TABS Take 1 tablet by mouth at bedtime.   Yes Historical Provider, MD  omeprazole (PRILOSEC) 20 MG capsule Take 20 mg by mouth daily.     Yes Historical Provider, MD  PARoxetine (PAXIL) 40 MG tablet Take 40 mg by mouth at bedtime.   Yes Historical Provider, MD  risperiDONE (RISPERDAL) 1 MG tablet Take 1 mg by mouth 2 (two) times daily.   Yes Historical Provider, MD  sertraline (ZOLOFT) 50 MG tablet Take 50 mg by mouth daily.   Yes Historical Provider, MD  LORazepam (ATIVAN) 0.5 MG tablet Take 0.25 mg by mouth every morning. For anxiety and aggegation    Historical Provider, MD  nystatin (MYCOSTATIN) powder Apply 60 g topically 2 (two) times daily. Apply to rash under arms until healed    Historical Provider, MD   BP 141/71  Pulse 81  Temp(Src) 99 F (37.2 C) (Rectal)  Resp 14  SpO2 96% Physical Exam  Constitutional: He appears well-developed and well-nourished. No distress.  HENT:  Head: Normocephalic and atraumatic.  Mouth/Throat: Oropharynx is clear and moist.  No oropharyngeal exudate.  Eyes: Conjunctivae and EOM are normal. Pupils are equal, round, and reactive to light.  Neck: Normal range of motion. Neck supple.  Cardiovascular: Normal rate, regular rhythm and intact distal pulses.   Pulmonary/Chest: Effort normal and breath sounds normal. He has no wheezes. He has no rales.  Abdominal: Soft. Bowel sounds are normal. There is no tenderness. There is no rebound and no guarding.  Musculoskeletal: Normal range of motion. He exhibits tenderness.       Right hand: He exhibits normal capillary refill and no deformity.  Cellulitis on hand  and up onto the arm  Neurological: He is alert. He has normal reflexes. No cranial nerve deficit.  Skin: Skin is warm and dry. There is erythema.  Psychiatric: He has a normal mood and affect.    ED Course  Procedures (including critical care time) Labs Review Labs Reviewed  CBC WITH DIFFERENTIAL - Abnormal; Notable for the following:    WBC 11.9 (*)    RBC 3.91 (*)    Hemoglobin 12.1 (*)    HCT 34.4 (*)    Neutro Abs 8.6 (*)    Monocytes Relative 15 (*)    Monocytes Absolute 1.8 (*)    All other components within normal limits  URINALYSIS, ROUTINE W REFLEX MICROSCOPIC - Abnormal; Notable for the following:    Hgb urine dipstick TRACE (*)    Urobilinogen, UA 2.0 (*)    All other components within normal limits  I-STAT CHEM 8, ED - Abnormal; Notable for the following:    Sodium 135 (*)    Glucose, Bld 112 (*)    Hemoglobin 12.2 (*)    HCT 36.0 (*)    All other components within normal limits  URINE MICROSCOPIC-ADD ON    Imaging Review Dg Chest 2 View  05/01/2014   CLINICAL DATA:  Cough, shortness of breath, right hand pain and swelling.  EXAM: CHEST  2 VIEW  COMPARISON:  05/03/2011  FINDINGS: Shallow inspiration. Heart size and pulmonary vascularity are mildly prominent. Mild pulmonary vascular congestion is suggested. No significant edema or consolidation. No blunting of costophrenic angles. No pneumothorax.  IMPRESSION: Mild cardiac enlargement and pulmonary vascular congestion without edema or consolidation.   Electronically Signed   By: Burman Nieves M.D.   On: 05/01/2014 02:28   Ct Head Wo Contrast  05/01/2014   CLINICAL DATA:  Hand pain coma aphasia, slurred speech.  EXAM: CT HEAD WITHOUT CONTRAST  TECHNIQUE: Contiguous axial images were obtained from the base of the skull through the vertex without intravenous contrast.  COMPARISON:  05/02/2011  FINDINGS: Diffuse cerebral atrophy. Low-attenuation changes in the deep white matter likely due to small vessel ischemia. No  mass effect or midline shift. No abnormal extra-axial fluid collections. Gray-white matter junctions are distinct. Basal cisterns are not effaced. No evidence of acute intracranial hemorrhage. No depressed skull fractures. Mucosal thickening in the paranasal sinuses. No acute air-fluid levels. Mastoid air cells are not opacified.  IMPRESSION: No acute intracranial abnormalities. Chronic atrophy and small vessel ischemia.   Electronically Signed   By: Burman Nieves M.D.   On: 05/01/2014 02:13   Dg Hand Complete Right  05/01/2014   CLINICAL DATA:  Right hand pain and swelling.  No reported injury.  EXAM: RIGHT HAND - COMPLETE 3+ VIEW  COMPARISON:  None.  FINDINGS: Diffuse degenerative changes throughout the interphalangeal, metacarpophalangeal, and carpal joints. No evidence of acute fracture or dislocation. The no focal bone lesion or bone erosion. Soft  tissues are unremarkable.  IMPRESSION: Diffuse degenerative changes in the right hand and wrist. No displaced fractures identified. No bone erosion to suggest osteomyelitis.   Electronically Signed   By: Burman NievesWilliam  Stevens M.D.   On: 05/01/2014 02:30     EKG Interpretation None      MDM   Final diagnoses:  None   Seen by neurology.  Will admit for MRI and cellulitis   Angelin Cutrone K Aeneas Longsworth-Rasch, MD 05/01/14 [phone]

## 2014-05-01 NOTE — Progress Notes (Addendum)
ANTIBIOTIC CONSULT NOTE - INITIAL  Pharmacy Consult for Vancomycin and Zosyn  Indication: cellulitis  No Known Allergies  Patient Measurements: Height: 5\' 7"  (170.2 cm) (estimated) Weight: 160 lb (72.576 kg) (estimated) IBW/kg (Calculated) : 66.1  Vital Signs: Temp: 99 F (37.2 C) (07/30 0244) Temp src: Rectal (07/30 0244) BP: 131/77 mmHg (07/30 0600) Pulse Rate: 65 (07/30 0600) Intake/Output from previous day:   Intake/Output from this shift:    Labs:  Recent Labs  05/01/14 0242 05/01/14 0250  WBC 11.9*  --   HGB 12.1* 12.2*  PLT 151  --   CREATININE  --  0.80   Estimated Creatinine Clearance: 64.3 ml/min (by C-G formula based on Cr of 0.8). No results found for this basename: VANCOTROUGH, VANCOPEAK, VANCORANDOM, GENTTROUGH, GENTPEAK, GENTRANDOM, TOBRATROUGH, TOBRAPEAK, TOBRARND, AMIKACINPEAK, AMIKACINTROU, AMIKACIN,  in the last 72 hours   Microbiology: No results found for this or any previous visit (from the past 720 hour(s)).  Medical History: Past Medical History  Diagnosis Date  . Arthritis   . Dementia   . Vertigo   . Thyroid disease     Medications:  Lipitor  Vit D  Aricept  Iron  Toviaz  Lactulose  Ativan  Melatonin  Prilosec  Paxil  Risperdal  Zoloft    Assessment: 80 yo male with right hand cellulitis for empiric antibiotics.  Vancomycin 1 g IV given in ED at 0400  Goal of Therapy:  Vancomycin trough level 10-15 mcg/ml  Plan:  Vancomycin 500 mg IV q12h Zosyn 3.375 g IV q8h   Abbott, Gary FleetGregory Vernon 05/01/2014,6:12 AM  ________  Given patient weight and renal function, increasing Vanc dose to 750mg  q12h starting at 1800 7/30  Thank you for allowing pharmacy to be part of this patient's care team  Marshawn Normoyle M. Gerda Yin, Pharm.D Clinical Pharmacy Resident Pager: [phone] 05/01/2014 .10:22 AM

## 2014-05-01 NOTE — H&P (Signed)
Hospitalist Admission History and Physical  Patient name: Corey Miller Medical record number: [id number] Date of birth: 03-17-1928 Age: 80 y.o. Gender: male  Primary Care Provider: No primary provider on file.  Chief Complaint: R hand cellulitis, ? Slurred speech  History of Present Illness:This is a 80 y.o. year old male with significant past medical history of dementia, vertigo presenting with R hand cellultis, ? Slurred speech. Level V caveat as pt is demented on presentation. Unable to answer questions. Pt is resident of [address]. Per report pt has had R hand redness and swelling. Unknown duration of time. Pt also with slurred speech per report at nursing home over last 24 hours. Pt brought by EMS.  tmax 99, HR 60s-80s, BP 130s-140s, Satting mid 90s on RA. WBC 11.9, Hgb 12.2, Cr 0.8. CXR negative for PNA-mild pulm vascular congestion. Head CT with no acute findings. R hand xray negative for any osteomyelitic changes. Neuroconsult. Noted smacking of lips, blowing air/spitting. UA negative for infection.   Assessment and Plan: Corey Miller is a 80 y.o. year old male presenting with R hand cellulitis, ? Slurred speech   Active Problems:   Cellulitis   1-R hand cellulitis -started on vanc and zosyn-continue  -blood cultures -affected area marked and dated.  -no osteomyelitic changes on imaging. Continue to follow.   2-? Slurred speech  -unclear if this is true CVA vs. Pt's baseline-? Baseline neuropsychiatric issue -on lacutlose-? Chronic hepatic encephalopathy-check ammonia level  -no significant focal deficits on exam tonight -check MRI brain w/o contrast per neuro recs.   3-Dementia  -Aricept  4-Pscyh  -continue regimen.   FEN/GI: heart healthy diet pending bedside swallow eval  Prophylaxis: sub q heparin  Disposition: pending further evaluation  Code Status:Full Code    Patient Active Problem List   Diagnosis Date Noted  . Cellulitis 05/01/2014   Past  Medical History: Past Medical History  Diagnosis Date  . Arthritis   . Dementia   . Vertigo   . Thyroid disease     Past Surgical History: Past Surgical History  Procedure Laterality Date  . Appendectomy      Social History: History   Social History  . Marital Status: Widowed    Spouse Name: N/A    Number of Children: N/A  . Years of Education: N/A   Social History Main Topics  . Smoking status: Never Smoker   . Smokeless tobacco: None  . Alcohol Use: No  . Drug Use: No  . Sexual Activity:    Other Topics Concern  . None   Social History Narrative  . None    Family History: History reviewed. No pertinent family history.  Allergies: No Known Allergies  Current Facility-Administered Medications  Medication Dose Route Frequency Provider Last Rate Last Dose  . atorvastatin (LIPITOR) tablet 5 mg  5 mg Oral QHS Doree Albee, MD      . cholecalciferol (VITAMIN D) tablet 400 Units  400 Units Oral Daily Doree Albee, MD      . donepezil (ARICEPT) tablet 10 mg  10 mg Oral QHS Doree Albee, MD      . ferrous sulfate tablet 325 mg  325 mg Oral Q breakfast Doree Albee, MD      . fesoterodine (TOVIAZ) tablet 8 mg  8 mg Oral Daily Doree Albee, MD      . heparin injection 5,000 Units  5,000 Units Subcutaneous 3 times per day Doree Albee, MD      . lactulose (  CHRONULAC) 10 GM/15ML solution 6.67 g  6.67 g Oral BID Doree AlbeeSteven Newton, MD      . levothyroxine (SYNTHROID, LEVOTHROID) tablet 125 mcg  125 mcg Oral q morning - 10a Doree AlbeeSteven Newton, MD      . LORazepam (ATIVAN) tablet 0.25 mg  0.25 mg Oral q morning - 10a Doree AlbeeSteven Newton, MD      . Melatonin TABS 5 mg  1 tablet Oral QHS Doree AlbeeSteven Newton, MD      . nystatin (MYCOSTATIN) powder 60 g  60 g Topical BID Doree AlbeeSteven Newton, MD      . pantoprazole (PROTONIX) EC tablet 40 mg  40 mg Oral Daily Doree AlbeeSteven Newton, MD      . PARoxetine (PAXIL) tablet 40 mg  40 mg Oral QHS Doree AlbeeSteven Newton, MD      . risperiDONE (RISPERDAL) tablet 1 mg  1 mg  Oral BID Doree AlbeeSteven Newton, MD      . sertraline (ZOLOFT) tablet 50 mg  50 mg Oral Daily Doree AlbeeSteven Newton, MD      . sodium chloride 0.9 % injection 3 mL  3 mL Intravenous Q12H Doree AlbeeSteven Newton, MD       Current Outpatient Prescriptions  Medication Sig Dispense Refill  . atorvastatin (LIPITOR) 10 MG tablet Take 5 mg by mouth at bedtime.      . cholecalciferol (VITAMIN D) 400 UNITS TABS Take 400 Units by mouth daily.      Marland Kitchen. donepezil (ARICEPT) 10 MG tablet Take 10 mg by mouth at bedtime.       . ferrous sulfate 325 (65 FE) MG tablet Take 325 mg by mouth daily with breakfast.        . fesoterodine (TOVIAZ) 8 MG TB24 Take 8 mg by mouth daily.       Marland Kitchen. lactulose (CHRONULAC) 10 GM/15ML solution Take 6.67 g by mouth 2 (two) times daily. Dose is3510ml      . levothyroxine (SYNTHROID, LEVOTHROID) 125 MCG tablet Take 125 mcg by mouth every morning.      . Melatonin 5 MG TABS Take 1 tablet by mouth at bedtime.      Marland Kitchen. omeprazole (PRILOSEC) 20 MG capsule Take 20 mg by mouth daily.        Marland Kitchen. PARoxetine (PAXIL) 40 MG tablet Take 40 mg by mouth at bedtime.      . risperiDONE (RISPERDAL) 1 MG tablet Take 1 mg by mouth 2 (two) times daily.      . sertraline (ZOLOFT) 50 MG tablet Take 50 mg by mouth daily.      Marland Kitchen. LORazepam (ATIVAN) 0.5 MG tablet Take 0.25 mg by mouth every morning. For anxiety and aggegation      . nystatin (MYCOSTATIN) powder Apply 60 g topically 2 (two) times daily. Apply to rash under arms until healed       Review Of Systems: 12 point ROS negative except as noted above in HPI.  Physical Exam: Filed Vitals:   05/01/14 0400  BP: 141/71  Pulse: 81  Temp:   Resp: 14    General: minimally cooperative to exam, spitting/blowing air in between words  HEENT: PERRLA and extra ocular movement intact Heart: S1, S2 normal, no murmur, rub or gallop, regular rate and rhythm Lungs: clear to auscultation, no wheezes or rales and unlabored breathing Abdomen: abdomen is soft without significant tenderness,  masses, organomegaly or guarding Extremities: RUE wiht swelling, mild erythema, warmth, decreased ROM 2/2 pain  Skin:as above  Neurology: minmally cooperative to exam   Labs and  Imaging: Lab Results  Component Value Date/Time   NA 135* 05/01/2014  2:50 AM   K 4.4 05/01/2014  2:50 AM   CL 103 05/01/2014  2:50 AM   CO2 24 05/03/2011 11:00 AM   BUN 10 05/01/2014  2:50 AM   CREATININE 0.80 05/01/2014  2:50 AM   GLUCOSE 112* 05/01/2014  2:50 AM   Lab Results  Component Value Date   WBC 11.9* 05/01/2014   HGB 12.2* 05/01/2014   HCT 36.0* 05/01/2014   MCV 88.0 05/01/2014   PLT 151 05/01/2014   Urinalysis    Component Value Date/Time   COLORURINE YELLOW 05/01/2014 0200   APPEARANCEUR CLEAR 05/01/2014 0200   LABSPEC 1.012 05/01/2014 0200   PHURINE 6.5 05/01/2014 0200   GLUCOSEU NEGATIVE 05/01/2014 0200   HGBUR TRACE* 05/01/2014 0200   BILIRUBINUR NEGATIVE 05/01/2014 0200   KETONESUR NEGATIVE 05/01/2014 0200   PROTEINUR NEGATIVE 05/01/2014 0200   UROBILINOGEN 2.0* 05/01/2014 0200   NITRITE NEGATIVE 05/01/2014 0200   LEUKOCYTESUR NEGATIVE 05/01/2014 0200       Dg Chest 2 View  05/01/2014   CLINICAL DATA:  Cough, shortness of breath, right hand pain and swelling.  EXAM: CHEST  2 VIEW  COMPARISON:  05/03/2011  FINDINGS: Shallow inspiration. Heart size and pulmonary vascularity are mildly prominent. Mild pulmonary vascular congestion is suggested. No significant edema or consolidation. No blunting of costophrenic angles. No pneumothorax.  IMPRESSION: Mild cardiac enlargement and pulmonary vascular congestion without edema or consolidation.   Electronically Signed   By: Burman Nieves M.D.   On: 05/01/2014 02:28   Ct Head Wo Contrast  05/01/2014   CLINICAL DATA:  Hand pain coma aphasia, slurred speech.  EXAM: CT HEAD WITHOUT CONTRAST  TECHNIQUE: Contiguous axial images were obtained from the base of the skull through the vertex without intravenous contrast.  COMPARISON:  05/02/2011  FINDINGS: Diffuse  cerebral atrophy. Low-attenuation changes in the deep white matter likely due to small vessel ischemia. No mass effect or midline shift. No abnormal extra-axial fluid collections. Gray-white matter junctions are distinct. Basal cisterns are not effaced. No evidence of acute intracranial hemorrhage. No depressed skull fractures. Mucosal thickening in the paranasal sinuses. No acute air-fluid levels. Mastoid air cells are not opacified.  IMPRESSION: No acute intracranial abnormalities. Chronic atrophy and small vessel ischemia.   Electronically Signed   By: Burman Nieves M.D.   On: 05/01/2014 02:13   Dg Hand Complete Right  05/01/2014   CLINICAL DATA:  Right hand pain and swelling.  No reported injury.  EXAM: RIGHT HAND - COMPLETE 3+ VIEW  COMPARISON:  None.  FINDINGS: Diffuse degenerative changes throughout the interphalangeal, metacarpophalangeal, and carpal joints. No evidence of acute fracture or dislocation. The no focal bone lesion or bone erosion. Soft tissues are unremarkable.  IMPRESSION: Diffuse degenerative changes in the right hand and wrist. No displaced fractures identified. No bone erosion to suggest osteomyelitis.   Electronically Signed   By: Burman Nieves M.D.   On: 05/01/2014 02:30           Doree Albee MD  Pager: [phone]

## 2014-05-01 NOTE — Progress Notes (Signed)
Utilization Review Completed.Corey Miller T7/30/2015  

## 2014-05-01 NOTE — Progress Notes (Signed)
Patient seen and examined, admitted by Dr. Alvester MorinNewton this morning H&P reviewed, agree with assessment and plan  Briefly 80 year old male with history of dementia, vertigo presented with right hand cellulitis and slurred speech. Patient was admitted for stroke workup, neurology consulted.  BP 124/70  Pulse 70  Temp(Src) 98.1 F (36.7 C) (Axillary)  Resp 19  Ht 5\' 7"  (1.702 m)  Wt 72.576 kg (160 lb)  BMI 25.05 kg/m2  SpO2 96%  Dysarthria - Stroke workup in progress, MRI of the brain showed no acute infarct, neurology consult  Cellulitis:  - Continue IV vancomycin and Anders GrantZosyn    Odetta Forness M.D. [facility] 05/01/2014, 12:34 PM  Pager: [phone]

## 2014-05-01 NOTE — ED Notes (Signed)
Updated pts niece on phone.

## 2014-05-01 NOTE — ED Notes (Signed)
Pt. Is at MRI

## 2014-05-01 NOTE — ED Notes (Signed)
Assisted Lawson FiscalLori, RN with changing of pt's stretcher linens and also placing a clean dry diaper on pt; pt readjusted on stretcher and warm blanket was given

## 2014-05-01 NOTE — ED Notes (Signed)
[facility] EMS called for redness and swelling to the right hand. Hand is hot and painful to the touch. Pt is also blowing air out of his mouth, making noise which is associated with slurred speech. EMS states that the nursing home noticed that the pt had slurred speech 24 hour ago. CBG 121.

## 2014-05-01 NOTE — Evaluation (Signed)
Clinical/Bedside Swallow Evaluation Patient Details  Name: Corey Miller MRN: [id number] Date of Birth: [birth year]  Today's Date: 05/01/2014 Time: 4098-1191 SLP Time Calculation (min): 17 min  Past Medical History:  Past Medical History  Diagnosis Date  . Arthritis   . Dementia   . Vertigo   . Thyroid disease    Past Surgical History:  Past Surgical History  Procedure Laterality Date  . Appendectomy     HPI:  Corey Miller is an 80 y.o. male who is a resident at [facility] and has a past medical history significant for dementia, arthritis, vertigo, and thyroid disease, brought to [facility] ED due to swelling, redness, tenderness of the right hand concerning for cellulitis and reportedly slurred speech for 24 hours as per nursing home staff (although this can not be documented conclusively). MRI was negative for acute infarct.   Assessment / Plan / Recommendation Clinical Impression  Patient exhibits what appears to be a primarily cognitively based dysphagia. Decreased attention to bolus and attempts to talk with intake exacerbate what is already prolonged mastication due to missing dentition. Pt with decreased cohesion and posterior propulsion resulting in oral residuals, requiring Mod cues from SLP for awareness and management with lingual sweep. Wet vocal quality was noted x1, however was cleared with a reflexive throat clear. Recommed Dys 2 textures and thin liquids with full supervision to manage impulsivity. SLP to follow briefly for tolerance.    Aspiration Risk  Moderate    Diet Recommendation Dysphagia 2 (Fine chop);Thin liquid   Liquid Administration via: Cup;Straw Medication Administration: Whole meds with puree Supervision: Patient able to self feed;Full supervision/cueing for compensatory strategies Compensations: Slow rate;Small sips/bites (watch for residue) Postural Changes and/or Swallow Maneuvers: Seated upright 90 degrees    Other  Recommendations Oral Care  Recommendations: Oral care BID   Follow Up Recommendations  Skilled Nursing facility    Frequency and Duration min 2x/week  1 week   Pertinent Vitals/Pain n/a    SLP Swallow Goals     Swallow Study Prior Functional Status       General Date of Onset: 05/01/14 HPI: Corey Miller is an 80 y.o. male who is a resident at [facility] and has a past medical history significant for dementia, arthritis, vertigo, and thyroid disease, brought to [facility] ED due to swelling, redness, tenderness of the right hand concerning for cellulitis and reportedly slurred speech for 24 hours as per nursing home staff (although this can not be documented conclusively). MRI was negative for acute infarct. Type of Study: Bedside swallow evaluation Previous Swallow Assessment: none in chart Diet Prior to this Study: Dysphagia 3 (soft);Thin liquids Temperature Spikes Noted: No Respiratory Status: Nasal cannula (2L) History of Recent Intubation: No Behavior/Cognition: Alert;Cooperative;Impulsive;Requires cueing;Hard of hearing Oral Cavity - Dentition: Edentulous Self-Feeding Abilities: Able to feed self Patient Positioning: Other (comment) (sitting upright EOB) Baseline Vocal Quality: Clear    Oral/Motor/Sensory Function     Ice Chips Ice chips: Not tested   Thin Liquid Thin Liquid: Impaired Presentation: Cup;Self Fed;Straw Pharyngeal  Phase Impairments: Suspected delayed Swallow;Wet Vocal Quality    Nectar Thick Nectar Thick Liquid: Not tested   Honey Thick Honey Thick Liquid: Not tested   Puree Puree: Not tested (patient declined)   Solid   GO    Solid: Impaired Presentation: Self Fed Oral Phase Impairments: Reduced labial seal;Reduced lingual movement/coordination;Impaired anterior to posterior transit;Poor awareness of bolus;Impaired mastication Oral Phase Functional Implications: Oral residue Pharyngeal Phase Impairments: Suspected delayed Swallow  Corey Miller, M.A.  CCC-SLP [phone]  Corey Miller, Corey Miller 05/01/2014,3:19 PM

## 2014-05-01 NOTE — Consult Note (Addendum)
Referring Physician: Dr. Nicanor Alcon.    Chief Complaint: slurred speech, lip smacking.  HPI:                                                                                                                                         Corey Miller is an 80 y.o. male who is a resident at [facility] and has a past medical history significant for dementia, arthritis, vertigo, and thyroid disease, brought to [facility] ED due to swelling, redness, tenderness of the right hand concerning for cellulitis and reportedly slurred speech for 24 hours as per nursing home staff (although this can not be documented conclusively). While in the ED he was noted to have lip smacking and spiting when he is not talking. Patient is alert and follows simple commands, but can not significantly contribute to his clinical history. He denies HA, vertigo, double vision, focal weakness, language or vision impairment. CT brain revealed no acute abnormality.   Date last known well: uncertain Time last known well: uncertain tPA Given: no, late presentation   Past Medical History  Diagnosis Date  . Arthritis   . Dementia   . Vertigo   . Thyroid disease     Past Surgical History  Procedure Laterality Date  . Appendectomy      History reviewed. No pertinent family history. Social History:  reports that he has never smoked. He does not have any smokeless tobacco history on file. He reports that he does not drink alcohol or use illicit drugs.  Allergies: No Known Allergies  Medications:                                                                                                                           I have reviewed the patient's current medications.  ROS: unable to be obtained reliably due to patient's dementia.                                                                                    History obtained from chart review  Physical exam: pleasant male in no apparent distress. Blood pressure 141/71, pulse  81, temperature 99 F (37.2 C), temperature source Rectal, resp. rate 14, SpO2 96.00%. Head: normocephalic. Neck: supple, no bruits, no JVD. Cardiac: no murmurs. Lungs: clear. Abdomen: soft, no tender, no mass. Extremities: swelling and tenderness right hand. Neurologic Examination:                                                                                                      General: Mental Status: Alert, awake, said that he is at [facility], disoriented also to year-month-situation. Speech fluent without evidence of aphasia.  Able to follow simple step commands without difficulty. Cranial Nerves: II: Discs flat bilaterally; Visual fields grossly normal, pupils equal, round, reactive to light and accommodation III,IV, VI: ptosis not present, extra-ocular motions intact bilaterally V,VII: smile symmetric, facial light touch sensation normal bilaterally VIII: hearing normal bilaterally IX,X: gag reflex present XI: bilateral shoulder shrug XII: midline tongue extension without atrophy or fasciculations Motor: Right UE is currently immobilized but otherwise he is able to move left UE and bilateral LE. Sensory: reacts to noxious stimuli Deep Tendon Reflexes:  2 left UE and knee jerks Plantars: Right: downgoing   Left: downgoing Cerebellar: Couldn't be tested. Gait:  Unable to test CV: pulses palpable throughout     Results for orders placed during the hospital encounter of 05/01/14 (from the past 48 hour(s))  URINALYSIS, ROUTINE W REFLEX MICROSCOPIC     Status: Abnormal   Collection Time    05/01/14  2:00 AM      Result Value Ref Range   Color, Urine YELLOW  YELLOW   APPearance CLEAR  CLEAR   Specific Gravity, Urine 1.012  1.005 - 1.030   pH 6.5  5.0 - 8.0   Glucose, UA NEGATIVE  NEGATIVE mg/dL   Hgb urine dipstick TRACE (*) NEGATIVE   Bilirubin Urine NEGATIVE  NEGATIVE   Ketones, ur NEGATIVE  NEGATIVE mg/dL   Protein, ur NEGATIVE  NEGATIVE mg/dL    Urobilinogen, UA 2.0 (*) 0.0 - 1.0 mg/dL   Nitrite NEGATIVE  NEGATIVE   Leukocytes, UA NEGATIVE  NEGATIVE  URINE MICROSCOPIC-ADD ON     Status: None   Collection Time    05/01/14  2:00 AM      Result Value Ref Range   WBC, UA 0-2  <3 WBC/hpf   RBC / HPF 3-6  <3 RBC/hpf   Bacteria, UA RARE  RARE   Urine-Other MUCOUS PRESENT    CBC WITH DIFFERENTIAL     Status: Abnormal   Collection Time    05/01/14  2:42 AM      Result Value Ref Range   WBC 11.9 (*) 4.0 - 10.5 K/uL   RBC 3.91 (*) 4.22 - 5.81 MIL/uL   Hemoglobin 12.1 (*) 13.0 - 17.0 g/dL   HCT 56.2 (*) 13.0 - 86.5 %   MCV 88.0  78.0 - 100.0 fL   MCH 30.9  26.0 - 34.0 pg   MCHC 35.2  30.0 - 36.0 g/dL   RDW 78.4  11.5 - 15.5 %   Platelets 151  150 - 400 K/uL   Neutrophils Relative % 72  43 - 77 %   Neutro Abs 8.6 (*) 1.7 - 7.7 K/uL   Lymphocytes Relative 13  12 - 46 %   Lymphs Abs 1.5  0.7 - 4.0 K/uL   Monocytes Relative 15 (*) 3 - 12 %   Monocytes Absolute 1.8 (*) 0.1 - 1.0 K/uL   Eosinophils Relative 0  0 - 5 %   Eosinophils Absolute 0.0  0.0 - 0.7 K/uL   Basophils Relative 0  0 - 1 %   Basophils Absolute 0.0  0.0 - 0.1 K/uL  I-STAT CHEM 8, ED     Status: Abnormal   Collection Time    05/01/14  2:50 AM      Result Value Ref Range   Sodium 135 (*) 137 - 147 mEq/L   Potassium 4.4  3.7 - 5.3 mEq/L   Chloride 103  96 - 112 mEq/L   BUN 10  6 - 23 mg/dL   Creatinine, Ser 4.090.80  0.50 - 1.35 mg/dL   Glucose, Bld 811112 (*) 70 - 99 mg/dL   Calcium, Ion 9.141.14  7.821.13 - 1.30 mmol/L   TCO2 27  0 - 100 mmol/L   Hemoglobin 12.2 (*) 13.0 - 17.0 g/dL   HCT 95.636.0 (*) 21.339.0 - 08.652.0 %   Dg Chest 2 View  05/01/2014   CLINICAL DATA:  Cough, shortness of breath, right hand pain and swelling.  EXAM: CHEST  2 VIEW  COMPARISON:  05/03/2011  FINDINGS: Shallow inspiration. Heart size and pulmonary vascularity are mildly prominent. Mild pulmonary vascular congestion is suggested. No significant edema or consolidation. No blunting of costophrenic angles. No  pneumothorax.  IMPRESSION: Mild cardiac enlargement and pulmonary vascular congestion without edema or consolidation.   Electronically Signed   By: Burman NievesWilliam  Stevens M.D.   On: 05/01/2014 02:28   Ct Head Wo Contrast  05/01/2014   CLINICAL DATA:  Hand pain coma aphasia, slurred speech.  EXAM: CT HEAD WITHOUT CONTRAST  TECHNIQUE: Contiguous axial images were obtained from the base of the skull through the vertex without intravenous contrast.  COMPARISON:  05/02/2011  FINDINGS: Diffuse cerebral atrophy. Low-attenuation changes in the deep white matter likely due to small vessel ischemia. No mass effect or midline shift. No abnormal extra-axial fluid collections. Gray-white matter junctions are distinct. Basal cisterns are not effaced. No evidence of acute intracranial hemorrhage. No depressed skull fractures. Mucosal thickening in the paranasal sinuses. No acute air-fluid levels. Mastoid air cells are not opacified.  IMPRESSION: No acute intracranial abnormalities. Chronic atrophy and small vessel ischemia.   Electronically Signed   By: Burman NievesWilliam  Stevens M.D.   On: 05/01/2014 02:13   Dg Hand Complete Right  05/01/2014   CLINICAL DATA:  Right hand pain and swelling.  No reported injury.  EXAM: RIGHT HAND - COMPLETE 3+ VIEW  COMPARISON:  None.  FINDINGS: Diffuse degenerative changes throughout the interphalangeal, metacarpophalangeal, and carpal joints. No evidence of acute fracture or dislocation. The no focal bone lesion or bone erosion. Soft tissues are unremarkable.  IMPRESSION: Diffuse degenerative changes in the right hand and wrist. No displaced fractures identified. No bone erosion to suggest osteomyelitis.   Electronically Signed   By: Burman NievesWilliam  Stevens M.D.   On: 05/01/2014 02:30     Assessment: [age] y.o. male found to have slurred speech for ? 24 hours at the nursing home. Neuro-exam is not really impressive. Lip  smacking/spitting seems to be behavioral in nature. Patient will be admitted to management of  right hand cellulitis and will get MRI brain to ruled out stroke. I wonder if patient's reported speech impairment could be related to underlying dementia. Will follow up.  Wyatt Portela, MD [facility] [phone]  05/01/2014, 4:21 AM

## 2014-05-02 DIAGNOSIS — R471 Dysarthria and anarthria: Secondary | ICD-10-CM

## 2014-05-02 DIAGNOSIS — F03918 Unspecified dementia, unspecified severity, with other behavioral disturbance: Secondary | ICD-10-CM | POA: Diagnosis present

## 2014-05-02 DIAGNOSIS — F0391 Unspecified dementia with behavioral disturbance: Secondary | ICD-10-CM

## 2014-05-02 LAB — CBC WITH DIFFERENTIAL/PLATELET
Basophils Absolute: 0 10*3/uL (ref 0.0–0.1)
Basophils Relative: 0 % (ref 0–1)
Eosinophils Absolute: 0 10*3/uL (ref 0.0–0.7)
Eosinophils Relative: 0 % (ref 0–5)
HEMATOCRIT: 36.2 % — AB (ref 39.0–52.0)
Hemoglobin: 12.7 g/dL — ABNORMAL LOW (ref 13.0–17.0)
LYMPHS PCT: 13 % (ref 12–46)
Lymphs Abs: 1.2 10*3/uL (ref 0.7–4.0)
MCH: 30.8 pg (ref 26.0–34.0)
MCHC: 35.1 g/dL (ref 30.0–36.0)
MCV: 87.9 fL (ref 78.0–100.0)
MONO ABS: 1.5 10*3/uL — AB (ref 0.1–1.0)
MONOS PCT: 15 % — AB (ref 3–12)
NEUTROS ABS: 6.9 10*3/uL (ref 1.7–7.7)
Neutrophils Relative %: 72 % (ref 43–77)
Platelets: 185 10*3/uL (ref 150–400)
RBC: 4.12 MIL/uL — ABNORMAL LOW (ref 4.22–5.81)
RDW: 12.9 % (ref 11.5–15.5)
WBC: 9.6 10*3/uL (ref 4.0–10.5)

## 2014-05-02 LAB — COMPREHENSIVE METABOLIC PANEL
ALBUMIN: 3.2 g/dL — AB (ref 3.5–5.2)
ALK PHOS: 80 U/L (ref 39–117)
ALT: 11 U/L (ref 0–53)
AST: 18 U/L (ref 0–37)
Anion gap: 16 — ABNORMAL HIGH (ref 5–15)
BILIRUBIN TOTAL: 0.8 mg/dL (ref 0.3–1.2)
BUN: 9 mg/dL (ref 6–23)
CHLORIDE: 98 meq/L (ref 96–112)
CO2: 23 mEq/L (ref 19–32)
Calcium: 9.1 mg/dL (ref 8.4–10.5)
Creatinine, Ser: 0.72 mg/dL (ref 0.50–1.35)
GFR calc Af Amer: >90 mL/min (ref >90)
GFR calc non Af Amer: 83 mL/min — ABNORMAL LOW (ref >90)
Glucose, Bld: 105 mg/dL — ABNORMAL HIGH (ref 70–99)
POTASSIUM: 3.6 meq/L — AB (ref 3.7–5.3)
Sodium: 137 mEq/L (ref 137–147)
Total Protein: 7.4 g/dL (ref 6.0–8.3)

## 2014-05-02 MED ORDER — CEPHALEXIN 500 MG PO CAPS: 500.0000 mg | ORAL_CAPSULE | Freq: Three times a day (TID) | ORAL | Status: DC

## 2014-05-02 MED ORDER — DOXYCYCLINE HYCLATE 100 MG PO CAPS: 100.0000 mg | ORAL_CAPSULE | Freq: Two times a day (BID) | ORAL | Status: DC

## 2014-05-02 MED ORDER — POTASSIUM CHLORIDE CRYS ER 20 MEQ PO TBCR
20.0000 meq | EXTENDED_RELEASE_TABLET | Freq: Once | ORAL | Status: AC
  Administered 2014-05-02: 20 meq via ORAL
  Filled 2014-05-02: qty 1

## 2014-05-02 NOTE — Progress Notes (Addendum)
Clarification with Dr. Isidoro Donningai since patient awaiting SNF placement possibly in am 05/03/14 re cardiac monitor, stated okay to leave telemetry off, had been discontinued for discharge, Berle MullLynn Zelina Jimerson RN

## 2014-05-02 NOTE — Progress Notes (Signed)
Speech Language Pathology Treatment: Dysphagia  Patient Details Name: Corey Miller Laughter MRN: [id number] DOB: 03/20/1928 Today's Date: 05/02/2014 Time: [id number] SLP Time Calculation (min): 15 min  Assessment / Plan / Recommendation Clinical Impression  Patient demonstrates increased sustained attention to self-feeding task today as compared to yesterday. Pt consumed lunch meal consisting of Dys 2 textures and thin liquids via straw with Min multimodal cues for utilization of swallow strategies, particularly to facilitate oral clearance of solid PO. Pt utilized a liquid wash with Mod I to facilitate clearance as well. An immediate throat clear was noted x1 after straw sip of thin liquid throughout meal. Recommend to continue current textures.   HPI HPI: Corey Miller Girgenti is an 80 y.o. male who [facility] and has a past medical history significant for dementia, arthritis, vertigo, and thyroid disease, brought to [facility] ED due to swelling, redness, tenderness of the right hand concerning for cellulitis and reportedly slurred speech for 24 hours as per nursing home staff (although this can not be documented conclusively). MRI was negative for acute infarct.   Pertinent Vitals n/a  SLP Plan  Continue with current plan of care    Recommendations Diet recommendations: Dysphagia 2 (fine chop);Thin liquid Liquids provided via: Cup;Straw Medication Administration: Whole meds with puree Supervision: Patient able to self feed;Intermittent supervision to cue for compensatory strategies Compensations: Slow rate;Small sips/bites Postural Changes and/or Swallow Maneuvers: Seated upright 90 degrees              Oral Care Recommendations: Oral care BID Follow up Recommendations: Skilled Nursing facility Plan: Continue with current plan of care    GO      Maxcine HamLaura Paiewonsky, M.A. CCC-SLP [phone]  Maxcine Hamaiewonsky, Dayshawn Irizarry 05/02/2014, 12:46 PM

## 2014-05-02 NOTE — Progress Notes (Signed)
Clinical Social Work Department BRIEF PSYCHOSOCIAL ASSESSMENT 05/02/2014  Patient:  Corey Miller,Corey Miller     Account Number:  [id number]     Admit date:  05/01/2014  Clinical Social Worker:  Corey Miller,Corey Miller, LCSWA  Date/Time:  05/02/2014 11:00 AM  Referred by:  Physician  Date Referred:  05/02/2014 Referred for  ALF Placement   Other Referral:   Interview type:  Family Other interview type:   Spoke with pt niece over the phone    PSYCHOSOCIAL DATA Living Status:  FACILITY Admitted from facility:  Other Level of care:  Assisted Living Primary support name:  Corey Miller [phone] Primary support relationship to patient:  SIBLING Degree of support available:   Pt has good support from family    CURRENT CONCERNS Current Concerns  Post-Acute Placement   Other Concerns:    SOCIAL WORK ASSESSMENT / PLAN CSW informed that pt was admitted from ALF. CSW called and spoke with pt niece Corey Miller who confirmed [facility] was [facility]. Pt has been at this facility for a few weeks because of their specialty in memory care. Pt family informed CSW they have had a positive experience so far with facility and plan will be for pt to return. Pt niece has no questions at this time but asked that her mother Corey Miller be contacted at discharge.    After speaking with Corey Miller, CSW informed by MD that plan is for dc today. CSW called pt sister Corey Miller and notified. She informed CSW she would like to transport pt. CSW to check with facility to confirm late dc will be okay as pt sister will likely not be at hospital until 5pm.   Assessment/plan status:  Psychosocial Support/Ongoing Assessment of Needs Other assessment/ plan:   Information/referral to community resources:   None needed    PATIENT'S/FAMILY'S RESPONSE TO PLAN OF CARE: Pt family pleasant and coopeartive. They are agreeable to pt returning to ALF.       Corey Miller, LCSWA [phone]

## 2014-05-02 NOTE — Progress Notes (Signed)
Clinical Social Work Department CLINICAL SOCIAL WORK PLACEMENT NOTE 05/02/2014  Patient:  Corey Miller,Corey Miller  Account Number:  [id number] Admit date:  05/01/2014  Clinical Social Worker:  Sharol HarnessPOONUM Annayah Worthley, Theresia MajorsLCSWA  Date/time:  05/02/2014 03:45 PM  Clinical Social Work is seeking post-discharge placement for this patient at the following level of care:   SKILLED NURSING   (*CSW will update this form in Epic as items are completed)   05/02/2014  Patient/family provided with [facility] Department of Clinical Social Work's list of facilities offering this level of care within the geographic area requested by the patient (or if unable, by the patient's family).  05/02/2014  Patient/family informed of their freedom to choose among providers that offer the needed level of care, that participate in Medicare, Medicaid or managed care program needed by the patient, have an available bed and are willing to accept the patient.  05/02/2014  Patient/family informed of [facility]' ownership interest in [facility], as well as of the fact that they are under no obligation to receive care at this facility.  PASARR submitted to EDS on  PASARR number received on   FL2 transmitted to all facilities in geographic area requested by pt/family on  05/02/2014 FL2 transmitted to all facilities within larger geographic area on   Patient informed that his/her managed care company has contracts with or will negotiate with  certain facilities, including the following:     Patient/family informed of bed offers received:   Patient chooses bed at  Physician recommends and patient chooses bed at    Patient to be transferred to  on   Patient to be transferred to facility by  Patient and family notified of transfer on  Name of family member notified:    The following physician request were entered in Epic:   Additional Comments:   Shaneal Barasch, LCSWA [phone]

## 2014-05-02 NOTE — Progress Notes (Signed)
Subjective: Patient has no complaints. Remains pleasant.  Asking if he can go home.   Objective: Current vital signs: BP 106/56  Pulse 70  Temp(Src) 97.9 F (36.6 C) (Oral)  Resp 18  Ht 5\' 7"  (1.702 m)  Wt 72.7 kg (160 lb 4.4 oz)  BMI 25.10 kg/m2  SpO2 96% Vital signs in last 24 hours: Temp:  [97.9 F (36.6 C)-98.3 F (36.8 C)] 97.9 F (36.6 C) (07/31 0817) Pulse Rate:  [70-88] 70 (07/31 0817) Resp:  [18] 18 (07/31 0817) BP: (106-148)/(56-88) 106/56 mmHg (07/31 0817) SpO2:  [94 %-97 %] 96 % (07/31 0817) Weight:  [72.7 kg (160 lb 4.4 oz)] 72.7 kg (160 lb 4.4 oz) (07/31 0400)  Intake/Output from previous day: 07/30 0701 - 07/31 0700 In: 200 [IV Piggyback:200] Out: 450 [Urine:450] Intake/Output this shift:   Nutritional status: Dysphagia  Neurologic Exam: General: NAD Mental Status: Alert, oriented to hospital but believes he is in [facility], not able to give the year or date. Speech dysarthric (has no dentures) able to articulate TIP TOP, [place] and [place]- no evidence of aphasia.  Able to follow 3 step commands without difficulty. Cranial Nerves: II:  Visual fields grossly normal, pupils equal, round, reactive to light and accommodation III,IV, VI: ptosis not present, extra-ocular motions intact bilaterally V,VII: smile symmetric, facial light touch sensation normal bilaterally VIII: hearing normal bilaterally IX,X: gag reflex present XI: bilateral shoulder shrug XII: midline tongue extension without atrophy or fasciculations  Motor: Moving all extremities antigravity with good 4/5-5/5 strength. No drift in UE or LE. Right grip weak due to pain.  Sensory: Pinprick and light touch intact throughout, bilaterally Deep Tendon Reflexes:  2+ bilateral UE and KJ, no AJ Plantars: Right: downgoing   Left: downgoing Cerebellar: Normal F-N.     Lab Results: Basic Metabolic Panel:  Recent Labs Lab 05/01/14 0250 05/02/14 0520  NA 135* 137  K 4.4 3.6*  CL 103 98  CO2   --  23  GLUCOSE 112* 105*  BUN 10 9  CREATININE 0.80 0.72  CALCIUM  --  9.1    Liver Function Tests:  Recent Labs Lab 05/02/14 0520  AST 18  ALT 11  ALKPHOS 80  BILITOT 0.8  PROT 7.4  ALBUMIN 3.2*   No results found for this basename: LIPASE, AMYLASE,  in the last 168 hours No results found for this basename: AMMONIA,  in the last 168 hours  CBC:  Recent Labs Lab 05/01/14 0242 05/01/14 0250 05/02/14 0520  WBC 11.9*  --  9.6  NEUTROABS 8.6*  --  6.9  HGB 12.1* 12.2* 12.7*  HCT 34.4* 36.0* 36.2*  MCV 88.0  --  87.9  PLT 151  --  185    Cardiac Enzymes: No results found for this basename: CKTOTAL, CKMB, CKMBINDEX, TROPONINI,  in the last 168 hours  Lipid Panel: No results found for this basename: CHOL, TRIG, HDL, CHOLHDL, VLDL, LDLCALC,  in the last 168 hours  CBG: No results found for this basename: GLUCAP,  in the last 168 hours  Microbiology: Results for orders placed during the hospital encounter of 05/01/14  CULTURE, BLOOD (ROUTINE X 2)     Status: None   Collection Time    05/01/14  5:49 AM      Result Value Ref Range Status   Specimen Description BLOOD RIGHT ARM   Final   Special Requests BOTTLES DRAWN AEROBIC AND ANAEROBIC 10CC EACH   Final   Culture  Setup Time  Final   Value: 05/01/2014 14:08     Performed at [facility]   Culture     Final   Value:        BLOOD CULTURE RECEIVED NO GROWTH TO DATE CULTURE WILL BE HELD FOR 5 DAYS BEFORE ISSUING A FINAL NEGATIVE REPORT     Performed at [facility]   Report Status PENDING   Incomplete  CULTURE, BLOOD (ROUTINE X 2)     Status: None   Collection Time    05/01/14  5:55 AM      Result Value Ref Range Status   Specimen Description BLOOD RIGHT HAND   Final   Special Requests BOTTLES DRAWN AEROBIC ONLY 10CC   Final   Culture  Setup Time     Final   Value: 05/01/2014 14:08     Performed at [facility]   Culture     Final   Value:        BLOOD CULTURE RECEIVED NO GROWTH TO  DATE CULTURE WILL BE HELD FOR 5 DAYS BEFORE ISSUING A FINAL NEGATIVE REPORT     Performed at [facility]   Report Status PENDING   Incomplete  MRSA PCR SCREENING     Status: None   Collection Time    05/01/14  8:02 PM      Result Value Ref Range Status   MRSA by PCR NEGATIVE  NEGATIVE Final   Comment:            The GeneXpert MRSA Assay (FDA     approved for NASAL specimens     only), is one component of a     comprehensive MRSA colonization     surveillance program. It is not     intended to diagnose MRSA     infection nor to guide or     monitor treatment for     MRSA infections.    Coagulation Studies: No results found for this basename: LABPROT, INR,  in the last 72 hours  Imaging: Dg Chest 2 View  05/01/2014   CLINICAL DATA:  Cough, shortness of breath, right hand pain and swelling.  EXAM: CHEST  2 VIEW  COMPARISON:  05/03/2011  FINDINGS: Shallow inspiration. Heart size and pulmonary vascularity are mildly prominent. Mild pulmonary vascular congestion is suggested. No significant edema or consolidation. No blunting of costophrenic angles. No pneumothorax.  IMPRESSION: Mild cardiac enlargement and pulmonary vascular congestion without edema or consolidation.   Electronically Signed   By: Burman Nieves M.D.   On: 05/01/2014 02:28   Ct Head Wo Contrast  05/01/2014   CLINICAL DATA:  Hand pain coma aphasia, slurred speech.  EXAM: CT HEAD WITHOUT CONTRAST  TECHNIQUE: Contiguous axial images were obtained from the base of the skull through the vertex without intravenous contrast.  COMPARISON:  05/02/2011  FINDINGS: Diffuse cerebral atrophy. Low-attenuation changes in the deep white matter likely due to small vessel ischemia. No mass effect or midline shift. No abnormal extra-axial fluid collections. Gray-white matter junctions are distinct. Basal cisterns are not effaced. No evidence of acute intracranial hemorrhage. No depressed skull fractures. Mucosal thickening in the  paranasal sinuses. No acute air-fluid levels. Mastoid air cells are not opacified.  IMPRESSION: No acute intracranial abnormalities. Chronic atrophy and small vessel ischemia.   Electronically Signed   By: Burman Nieves M.D.   On: 05/01/2014 02:13   Mr Brain Wo Contrast  05/01/2014   CLINICAL DATA:  Slurred speech.  EXAM: MRI HEAD  WITHOUT CONTRAST  TECHNIQUE: Multiplanar, multiecho pulse sequences of the brain and surrounding structures were obtained without intravenous contrast.  COMPARISON:  05/01/2014 CT.  07/06/2010 MR.  FINDINGS: No acute infarct.  No intracranial hemorrhage.  No intracranial mass lesion noted on this unenhanced exam.  Mild global atrophy without hydrocephalus.  Major intracranial vascular structures are patent.  Left-sided C2-3 facet joint degenerative changes. Degenerative changes C1-2 articulation.  Slightly heterogeneous bone marrow of the clivus without change.  Small pituitary gland stable.  Cervical medullary junction unremarkable.  Post lens replacement otherwise orbital structures unremarkable.  Minimal paranasal sinus mucosal thickening.  IMPRESSION: No acute infarct.   Electronically Signed   By: Bridgett Larsson M.D.   On: 05/01/2014 08:06   Dg Hand Complete Right  05/01/2014   CLINICAL DATA:  Right hand pain and swelling.  No reported injury.  EXAM: RIGHT HAND - COMPLETE 3+ VIEW  COMPARISON:  None.  FINDINGS: Diffuse degenerative changes throughout the interphalangeal, metacarpophalangeal, and carpal joints. No evidence of acute fracture or dislocation. The no focal bone lesion or bone erosion. Soft tissues are unremarkable.  IMPRESSION: Diffuse degenerative changes in the right hand and wrist. No displaced fractures identified. No bone erosion to suggest osteomyelitis.   Electronically Signed   By: Burman Nieves M.D.   On: 05/01/2014 02:30    Medications:  Scheduled: . atorvastatin  5 mg Oral QHS  . cholecalciferol  400 Units Oral Daily  . donepezil  10 mg Oral QHS   . ferrous sulfate  325 mg Oral Q breakfast  . fesoterodine  8 mg Oral Daily  . heparin  5,000 Units Subcutaneous 3 times per day  . lactulose  6.67 g Oral BID  . levothyroxine  125 mcg Oral QAC breakfast  . LORazepam  0.25 mg Oral q morning - 10a  . nystatin   Topical BID  . pantoprazole  40 mg Oral Daily  . PARoxetine  40 mg Oral QHS  . risperiDONE  1 mg Oral BID  . sertraline  50 mg Oral Daily  . sodium chloride  3 mL Intravenous Q12H  . vancomycin  750 mg Intravenous Q12H    Assessment/Plan:  80 YO male [facility] to [facility] with right hand cellulitis and ? 24 hour period of slurred speech. MRI brain shows no acute infarct.  Speech has seen pateint and agrees dysarthria likely secondary to underlying dementia/ adentulous.    No further recommendations. Neuro will S/O    Felicie Morn PA-C [facility] [phone]  05/02/2014, 10:10 AM

## 2014-05-02 NOTE — Care Management Note (Signed)
Patient is active with [facility] for HH[facility] and OT.  Resumption of care orders requested upon discharge.

## 2014-05-02 NOTE — Evaluation (Signed)
Physical Therapy Evaluation Patient Details Name: Corey Miller MRN: [id number] DOB: [birth year] Today's Date: 05/02/2014   History of Present Illness  Patient is a 80 y/o male admitted for right hand cellulitis and slurred speech?. PMH positive for dementia, vertigo, thyroid disease and arthritis. CXR negative for PNA-mild pulm vascular congestion. Head CT with no acute findings. R hand xray negative for any osteomyelitic changes. UA negative for infection.    Clinical Impression  Patient presents with functional limitations due to deficits listed in PT problem list (see below). Pt presents with baseline cognitive deficits secondary to dementia affecting safety awareness. Pt is not safe to be home alone due to balance deficits, impaired mobility and impaired cognition. Requires assist for transfers at this time and close min guard for ambulation to maintain safety. Pt would benefit from skilled PT in [facility] SNF rehab prior to returning home to maximize independence and improve safe mobility. If family wants patient to return to ALF, pt will need more supervision initially and HHPT follow up, as well as RW for support.    Follow Up Recommendations SNF;Supervision for mobility/OOB (If family wants pt to return to ALF, pt will need HHPT and supervision for safety.)    Equipment Recommendations  Rolling walker with 5" wheels (if he does not have one.)    Recommendations for Other Services       Precautions / Restrictions Precautions Precautions: Fall Restrictions Weight Bearing Restrictions: No      Mobility  Bed Mobility               General bed mobility comments: Sitting in chair upon arrival.   Transfers Overall transfer level: Needs assistance Equipment used: Rolling walker (2 wheeled) Transfers: Sit to/from Stand Sit to Stand: Min assist         General transfer comment: Stood from chair x1 with VC for hand placement and for anterior translation.    Ambulation/Gait Ambulation/Gait assistance: Min guard Ambulation Distance (Feet): 150 Feet Assistive device: Rolling walker (2 wheeled) Gait Pattern/deviations: Step-through pattern;Decreased stride length;Trunk flexed;Drifts right/left Gait velocity: Decreased Gait velocity interpretation: Below normal speed for age/gender General Gait Details: Demonstrates Min guard for safety due to unsteadiness with ambulation. SOB present worsened towards end of ambulation. Seems familiar using RW however reports not using one previously.  Stairs            Wheelchair Mobility    Modified Rankin (Stroke Patients Only)       Balance Overall balance assessment: Needs assistance   Sitting balance-Leahy Scale: Fair     Standing balance support: During functional activity;Bilateral upper extremity supported Standing balance-Leahy Scale: Poor Standing balance comment: Use of RW for support, initially required Min A for balance progressing to Min guard during ambulation.                              Pertinent Vitals/Pain Pt reports pain in right hand. Able to use it for all functional tasks and to grip walker handles. SOB present during ambulation on RA with Sa02 decreasing to 84%. Improved to 95% by donning [place].     Home Living Family/patient expects to be discharged to:: Skilled nursing facility                      Prior Function Level of Independence: Needs assistance   Gait / Transfers Assistance Needed: PTA, pt reports ambulating without use of AD, however not  sure of accuracy of report as pt with dementia and poor historian. "Do I need to use that now?" referring to RW.  ADL's / Homemaking Assistance Needed: Reports assist required with bathing.         Hand Dominance        Extremity/Trunk Assessment   Upper Extremity Assessment: Overall WFL for tasks assessed Swelling, redness and erythema present in right hand secondary to cellulitis. Limited  digit flexion/extension secondary to discomfort and tightness. Able to use right hand functionally.           Lower Extremity Assessment: Overall WFL for tasks assessed         Communication   Communication: HOH  Cognition Arousal/Alertness: Awake/alert Behavior During Therapy: WFL for tasks assessed/performed Overall Cognitive Status: History of cognitive impairments - at baseline Area of Impairment: Orientation Orientation Level: Disoriented to;Place;Time             General Comments: Requires frequent repetition of cues.    General Comments      Exercises General Exercises - Lower Extremity Ankle Circles/Pumps: Both;10 reps;Seated Long Arc Quad: Both;10 reps;Seated Hip ABduction/ADduction: Both;10 reps;Seated Hip Flexion/Marching: Both;10 reps;Seated      Assessment/Plan    PT Assessment Patient needs continued PT services  PT Diagnosis Difficulty walking   PT Problem List Cardiopulmonary status limiting activity;Decreased cognition;Decreased activity tolerance;Decreased balance;Decreased safety awareness;Decreased mobility;Decreased knowledge of precautions  PT Treatment Interventions Balance training;Gait training;Cognitive remediation;Functional mobility training;Therapeutic activities;Therapeutic exercise;Patient/family education   PT Goals (Current goals can be found in the Care Plan section) Acute Rehab PT Goals Patient Stated Goal: to go home PT Goal Formulation: With patient Time For Goal Achievement: 05/16/14 Potential to Achieve Goals: Good    Frequency Min 3X/week   Barriers to discharge Decreased caregiver support Per notes, pt from ALF. Does not have supervision at home.    Co-evaluation               End of Session Equipment Utilized During Treatment: Gait belt Activity Tolerance: Patient tolerated treatment well Patient left: in chair;with call bell/phone within reach;with chair alarm set Nurse Communication: Mobility  status;Precautions         Time: [id number] PT Time Calculation (min): 25 min   Charges:   PT Evaluation $Initial PT Evaluation Tier I: 1 Procedure PT Treatments $Therapeutic Activity: 8-22 mins   PT G CodesAlvie Heidelberg:          Folan, Tychelle Purkey A 05/02/2014, 2:37 PM Alvie HeidelbergShauna Folan, PT, DPT [phone]

## 2014-05-02 NOTE — Progress Notes (Addendum)
CSW (Clinical Child psychotherapistocial Worker) aware of PT recommendation. CSW spoke with facility and they are concerned with pt current condition as it seems much different than how pt was prior to admission. CSW spoke with pt sister and she agreed that if pt is weaker he should go to rehab. Pt sister hoping pt can dc to [facility] but agreeable to referral being sent to all [facility] facilities. CSW called [facility] and they will review clinicals. CSW spoke with MD and plan will be for potential dc tomorrow.  CSW did call [facility] and inform pt will need to be admitted as Medicaid pt since there is no qualifying stay.  Poonum Ambelal, LCSWA [phone]

## 2014-05-02 NOTE — Progress Notes (Signed)
CSW (Clinical Child psychotherapistocial Worker) continues to try and contact facility to inquire about diet options at facility and also if pt sister can transport after 5pm. Have left mutliple messages for [employer] and Tammy and will await call back.  Saralee Bolick, LCSWA [phone]

## 2014-05-02 NOTE — Discharge Summary (Signed)
Physician Discharge Summary  Patient ID: Corey Miller MRN: [id number] DOB/AGE: Nov 13, 1927 80 y.o.  Admit date: 05/01/2014 Discharge date: 05/02/2014  Primary Care Physician:  No primary provider on file.  Discharge Diagnoses:    . Cellulitis right hand improving  . Dysarthria . Dementia   Consults: Neurology, Dr. Cyril Mourning   Recommendations for Outpatient Follow-up:  Please note patient is placed on antibiotics for 10 days for the right hand superficial cellulitis. Patient will need appointment with facility M.D. in one week for observation of the cellulitis.  dysphagia 2 diet with thin liquids  Allergies:  No Known Allergies   Discharge Medications:   Medication List         atorvastatin 10 MG tablet  Commonly known as:  LIPITOR  Take 5 mg by mouth at bedtime.     cephALEXin 500 MG capsule  Commonly known as:  KEFLEX  Take 1 capsule (500 mg total) by mouth 3 (three) times daily. X 10days     cholecalciferol 400 UNITS Tabs tablet  Commonly known as:  VITAMIN D  Take 400 Units by mouth daily.     donepezil 10 MG tablet  Commonly known as:  ARICEPT  Take 10 mg by mouth at bedtime.     doxycycline 100 MG capsule  Commonly known as:  VIBRAMYCIN  Take 1 capsule (100 mg total) by mouth 2 (two) times daily. X 10days     ferrous sulfate 325 (65 FE) MG tablet  Take 325 mg by mouth daily with breakfast.     fesoterodine 8 MG Tb24 tablet  Commonly known as:  TOVIAZ  Take 8 mg by mouth daily.     lactulose 10 GM/15ML solution  Commonly known as:  CHRONULAC  Take 6.67 g by mouth 2 (two) times daily. Dose is12ml     levothyroxine 125 MCG tablet  Commonly known as:  SYNTHROID, LEVOTHROID  Take 125 mcg by mouth every morning.     LORazepam 0.5 MG tablet  Commonly known as:  ATIVAN  Take 0.25 mg by mouth every morning. For anxiety and aggegation     Melatonin 5 MG Tabs  Take 1 tablet by mouth at bedtime.     nystatin powder  Commonly known as:  MYCOSTATIN   Apply 60 g topically 2 (two) times daily. Apply to rash under arms until healed     omeprazole 20 MG capsule  Commonly known as:  PRILOSEC  Take 20 mg by mouth daily.     PARoxetine 40 MG tablet  Commonly known as:  PAXIL  Take 40 mg by mouth at bedtime.     risperiDONE 1 MG tablet  Commonly known as:  RISPERDAL  Take 1 mg by mouth 2 (two) times daily.     sertraline 50 MG tablet  Commonly known as:  ZOLOFT  Take 50 mg by mouth daily.         Brief H and P: For complete details please refer to admission H and P, but in brief This is a 80 y.o. year old male with significant past medical history of dementia, vertigo presenting with R hand cellultis, ? Slurred speech. Level V caveat as pt is demented on presentation. Unable to answer questions. Pt is resident of [address]. Per report pt has had R hand redness and swelling. Unknown duration of time. Pt also with slurred speech per report at nursing home over last 24 hours. Pt brought by EMS.  tmax 99, HR 60s-80s, BP 130s-140s, Satting  mid 90s on RA. WBC 11.9, Hgb 12.2, Cr 0.8. CXR negative for PNA-mild pulm vascular congestion. Head CT with no acute findings. R hand xray negative for any osteomyelitic changes. Noted smacking of lips, blowing air/spitting. UA negative for infection.    Hospital Course:     Cellulitis right hand: Improving, patient received IV vancomycin and Zosyn During hospitalization. X-rays of the head did not show any osteomyelitis. He does not have any fevers or chills or any further tenderness. The erythema is improving. Patient will be discharged on oral doxycycline and Keflex for 10 days. Please have patient followup with facility physician/M.D. in a week for observation.    Dysarthria: Due to concern for possible TIA/stroke, patient was admitted for troke workup. Neurology consult was obtained and patient was seen by Dr. Cyril Mourningamillo, who did not feel that neurology exam was impressive for a stroke. Patient has  lip smacking/spitting seems to be behavioral in nature and possible speech impairment could be related to underlying dementia. MRI of the brain was obtained which showed no acute CVA. Patient was seen by speech therapy and recommended dysphagia 2 diet with thin liquids    Dementia : Currently stable, pleasant and requesting to go home. PT evaluation was done and recommended skilled nursing facility.     Day of Discharge BP 106/56  Pulse 70  Temp(Src) 97.9 F (36.6 C) (Oral)  Resp 18  Ht 5\' 7"  (1.702 m)  Wt 72.7 kg (160 lb 4.4 oz)  BMI 25.10 kg/m2  SpO2 96%  Physical Exam: General: Alert and awake, NAD CVS: S1-S2 clear no murmur rubs or gallops Chest: clear to auscultation bilaterally, no wheezing rales or rhonchi Abdomen: soft nontender, nondistended, normal bowel sounds Extremities: no cyanosis, clubbing or edema  lower extremities noted bilaterally, right hand with mild erythema, no significant tenderness, range of motion and improving  Neuro: Cranial nerves II-XII intact, no focal neurological deficits   The results of significant diagnostics from this hospitalization (including imaging, microbiology, ancillary and laboratory) are listed below for reference.    LAB RESULTS: Basic Metabolic Panel:  Recent Labs Lab 05/01/14 0250 05/02/14 0520  NA 135* 137  K 4.4 3.6*  CL 103 98  CO2  --  23  GLUCOSE 112* 105*  BUN 10 9  CREATININE 0.80 0.72  CALCIUM  --  9.1   Liver Function Tests:  Recent Labs Lab 05/02/14 0520  AST 18  ALT 11  ALKPHOS 80  BILITOT 0.8  PROT 7.4  ALBUMIN 3.2*   No results found for this basename: LIPASE, AMYLASE,  in the last 168 hours No results found for this basename: AMMONIA,  in the last 168 hours CBC:  Recent Labs Lab 05/01/14 0242 05/01/14 0250 05/02/14 0520  WBC 11.9*  --  9.6  NEUTROABS 8.6*  --  6.9  HGB 12.1* 12.2* 12.7*  HCT 34.4* 36.0* 36.2*  MCV 88.0  --  87.9  PLT 151  --  185   Cardiac Enzymes: No results found  for this basename: CKTOTAL, CKMB, CKMBINDEX, TROPONINI,  in the last 168 hours BNP: No components found with this basename: POCBNP,  CBG: No results found for this basename: GLUCAP,  in the last 168 hours  Significant Diagnostic Studies:  Dg Chest 2 View  05/01/2014   CLINICAL DATA:  Cough, shortness of breath, right hand pain and swelling.  EXAM: CHEST  2 VIEW  COMPARISON:  05/03/2011  FINDINGS: Shallow inspiration. Heart size and pulmonary vascularity are mildly prominent. Mild  pulmonary vascular congestion is suggested. No significant edema or consolidation. No blunting of costophrenic angles. No pneumothorax.  IMPRESSION: Mild cardiac enlargement and pulmonary vascular congestion without edema or consolidation.   Electronically Signed   By: Burman Nieves M.D.   On: 05/01/2014 02:28   Ct Head Wo Contrast  05/01/2014   CLINICAL DATA:  Hand pain coma aphasia, slurred speech.  EXAM: CT HEAD WITHOUT CONTRAST  TECHNIQUE: Contiguous axial images were obtained from the base of the skull through the vertex without intravenous contrast.  COMPARISON:  05/02/2011  FINDINGS: Diffuse cerebral atrophy. Low-attenuation changes in the deep white matter likely due to small vessel ischemia. No mass effect or midline shift. No abnormal extra-axial fluid collections. Gray-white matter junctions are distinct. Basal cisterns are not effaced. No evidence of acute intracranial hemorrhage. No depressed skull fractures. Mucosal thickening in the paranasal sinuses. No acute air-fluid levels. Mastoid air cells are not opacified.  IMPRESSION: No acute intracranial abnormalities. Chronic atrophy and small vessel ischemia.   Electronically Signed   By: Burman Nieves M.D.   On: 05/01/2014 02:13   Mr Brain Wo Contrast  05/01/2014   CLINICAL DATA:  Slurred speech.  EXAM: MRI HEAD WITHOUT CONTRAST  TECHNIQUE: Multiplanar, multiecho pulse sequences of the brain and surrounding structures were obtained without intravenous  contrast.  COMPARISON:  05/01/2014 CT.  07/06/2010 MR.  FINDINGS: No acute infarct.  No intracranial hemorrhage.  No intracranial mass lesion noted on this unenhanced exam.  Mild global atrophy without hydrocephalus.  Major intracranial vascular structures are patent.  Left-sided C2-3 facet joint degenerative changes. Degenerative changes C1-2 articulation.  Slightly heterogeneous bone marrow of the clivus without change.  Small pituitary gland stable.  Cervical medullary junction unremarkable.  Post lens replacement otherwise orbital structures unremarkable.  Minimal paranasal sinus mucosal thickening.  IMPRESSION: No acute infarct.   Electronically Signed   By: Bridgett Larsson M.D.   On: 05/01/2014 08:06   Dg Hand Complete Right  05/01/2014   CLINICAL DATA:  Right hand pain and swelling.  No reported injury.  EXAM: RIGHT HAND - COMPLETE 3+ VIEW  COMPARISON:  None.  FINDINGS: Diffuse degenerative changes throughout the interphalangeal, metacarpophalangeal, and carpal joints. No evidence of acute fracture or dislocation. The no focal bone lesion or bone erosion. Soft tissues are unremarkable.  IMPRESSION: Diffuse degenerative changes in the right hand and wrist. No displaced fractures identified. No bone erosion to suggest osteomyelitis.   Electronically Signed   By: Burman Nieves M.D.   On: 05/01/2014 02:30       Disposition and Follow-up: Discharge Instructions   Discharge instructions    Complete by:  As directed   Diet: Dysphagia 2     Increase activity slowly    Complete by:  As directed             DISPOSITION: Skilled nursing facility  DIET: Dysphagia 2 diet, thin liquids    DISCHARGE FOLLOW-UP Follow-up Information   Schedule an appointment as soon as possible for a visit in 1 week to follow up. (for hospital follow-up)    Contact information:   please follow with your facility physician      Time spent on Discharge: 40 minutes   Signed:   Corrinne Benegas M.D. [facility] 05/02/2014, 11:10 AM Pager: [phone]   **Disclaimer: This note was dictated with voice recognition software. Similar sounding words can inadvertently be transcribed and this note may contain transcription errors which may not have been corrected upon publication  of note.**

## 2014-05-03 LAB — COMPREHENSIVE METABOLIC PANEL
ALK PHOS: 70 U/L (ref 39–117)
ALT: 9 U/L (ref 0–53)
AST: 15 U/L (ref 0–37)
Albumin: 2.8 g/dL — ABNORMAL LOW (ref 3.5–5.2)
Anion gap: 10 (ref 5–15)
BILIRUBIN TOTAL: 0.4 mg/dL (ref 0.3–1.2)
BUN: 14 mg/dL (ref 6–23)
CHLORIDE: 100 meq/L (ref 96–112)
CO2: 23 mEq/L (ref 19–32)
Calcium: 9.2 mg/dL (ref 8.4–10.5)
Creatinine, Ser: 0.77 mg/dL (ref 0.50–1.35)
GFR, EST NON AFRICAN AMERICAN: 81 mL/min — AB (ref >90)
GLUCOSE: 94 mg/dL (ref 70–99)
POTASSIUM: 3.7 meq/L (ref 3.7–5.3)
Sodium: 133 mEq/L — ABNORMAL LOW (ref 137–147)
Total Protein: 6.9 g/dL (ref 6.0–8.3)

## 2014-05-03 LAB — CBC WITH DIFFERENTIAL/PLATELET
Basophils Absolute: 0 10*3/uL (ref 0.0–0.1)
Basophils Relative: 0 % (ref 0–1)
EOS PCT: 4 % (ref 0–5)
Eosinophils Absolute: 0.4 10*3/uL (ref 0.0–0.7)
HEMATOCRIT: 35 % — AB (ref 39.0–52.0)
Hemoglobin: 12.1 g/dL — ABNORMAL LOW (ref 13.0–17.0)
LYMPHS ABS: 1.6 10*3/uL (ref 0.7–4.0)
LYMPHS PCT: 17 % (ref 12–46)
MCH: 30.6 pg (ref 26.0–34.0)
MCHC: 34.6 g/dL (ref 30.0–36.0)
MCV: 88.6 fL (ref 78.0–100.0)
MONOS PCT: 14 % — AB (ref 3–12)
Monocytes Absolute: 1.3 10*3/uL — ABNORMAL HIGH (ref 0.1–1.0)
NEUTROS ABS: 6.3 10*3/uL (ref 1.7–7.7)
NEUTROS PCT: 65 % (ref 43–77)
PLATELETS: 179 10*3/uL (ref 150–400)
RBC: 3.95 MIL/uL — ABNORMAL LOW (ref 4.22–5.81)
RDW: 12.9 % (ref 11.5–15.5)
WBC: 9.7 10*3/uL (ref 4.0–10.5)

## 2014-05-03 MED ORDER — RISPERIDONE 1 MG PO TABS: 1.0000 mg | ORAL_TABLET | Freq: Two times a day (BID) | ORAL | Status: DC

## 2014-05-03 MED ORDER — LORAZEPAM 0.5 MG PO TABS: 0.2500 mg | ORAL_TABLET | Freq: Every morning | ORAL | Status: DC

## 2014-05-03 MED ORDER — SERTRALINE HCL 50 MG PO TABS: 50.0000 mg | ORAL_TABLET | Freq: Every day | ORAL | Status: DC

## 2014-05-03 MED ORDER — PAROXETINE HCL 40 MG PO TABS: 40.0000 mg | ORAL_TABLET | Freq: Every day | ORAL | Status: DC

## 2014-05-03 NOTE — Progress Notes (Signed)
Report has been called to [facility] Nurse Lorrin GoodellNicky Owens; questions answered. Spouse and daughter at the bedside at the time of discharge. Made aware of SNF patient has been discharged to. [facility]; awake, alert, and conversant.

## 2014-05-03 NOTE — Discharge Planning (Addendum)
Pt to dc to [facility], SNF Transportation: [facility] Family at bedside and aware of transfer. SNF aware of transfer and agreeable [facility]([place])  Transportation has been called  Vickii PennaGina Chikita Dogan, LCSWA [phone]  Clinical Social Work

## 2014-05-03 NOTE — Progress Notes (Signed)
Patient ID: Corey Miller  male  ZOX:[id number]    DOB: 03/13/1928    DOA: 05/01/2014  PCP: No primary provider on file.  Assessment/Plan: Principal Problem:   Cellulitis right hand - Continue IV vancomycin for now, DC on oral doxycycline and Keflex for 10 days  Active Problems:   Dysarthria - Stroke workup completed, per neurology dysarthria and possible seizure impairment could be related to underlying dementia, MRI was negative for CVA    Dementia  - Currently stable  Dysphagia: Swallow evaluation was done, dysphagia 2 diet with thin liquids  PTOT evaluation done, recommended self   DVT Prophylaxis:  Code Status:  Family Communication:  Disposition: DC to skilled nursing facility, DC summary done yesterday 7/31, no changes  Consultants:  Neurology  Procedures:  None  Antibiotics:  Vancomycin  Subjective: Patient seen and examined, looking forward to be released from the hospital, no acute complaints, right hand cellulitis improving  Objective: Weight change: 0.702 kg (1 lb 8.8 oz)  Intake/Output Summary (Last 24 hours) at 05/03/14 0943 Last data filed at 05/03/14 0600  Gross per 24 hour  Intake    630 ml  Output    576 ml  Net     54 ml   Blood pressure 117/69, pulse 76, temperature 98.1 F (36.7 C), temperature source Oral, resp. rate 18, height 5\' 7"  (1.702 m), weight 73.402 kg (161 lb 13.2 oz), SpO2 95.00%.  Physical Exam: General: Alert and awake, oriented, not in any acute distress. CVS: S1-S2 clear, no murmur rubs or gallops Chest: clear to auscultation bilaterally, no wheezing, rales or rhonchi Abdomen: soft nontender, nondistended, normal bowel sounds  Extremities: no cyanosis, clubbing, right hand cellulitis improving   Lab Results: Basic Metabolic Panel:  Recent Labs Lab 05/02/14 0520 05/03/14 0403  NA 137 133*  K 3.6* 3.7  CL 98 100  CO2 23 23  GLUCOSE 105* 94  BUN 9 14  CREATININE 0.72 0.77  CALCIUM 9.1 9.2   Liver  Function Tests:  Recent Labs Lab 05/02/14 0520 05/03/14 0403  AST 18 15  ALT 11 9  ALKPHOS 80 70  BILITOT 0.8 0.4  PROT 7.4 6.9  ALBUMIN 3.2* 2.8*   No results found for this basename: LIPASE, AMYLASE,  in the last 168 hours No results found for this basename: AMMONIA,  in the last 168 hours CBC:  Recent Labs Lab 05/02/14 0520 05/03/14 0403  WBC 9.6 9.7  NEUTROABS 6.9 6.3  HGB 12.7* 12.1*  HCT 36.2* 35.0*  MCV 87.9 88.6  PLT 185 179   Cardiac Enzymes: No results found for this basename: CKTOTAL, CKMB, CKMBINDEX, TROPONINI,  in the last 168 hours BNP: No components found with this basename: POCBNP,  CBG: No results found for this basename: GLUCAP,  in the last 168 hours   Micro Results: Recent Results (from the past 240 hour(s))  CULTURE, BLOOD (ROUTINE X 2)     Status: None   Collection Time    05/01/14  5:49 AM      Result Value Ref Range Status   Specimen Description BLOOD RIGHT ARM   Final   Special Requests BOTTLES DRAWN AEROBIC AND ANAEROBIC 10CC EACH   Final   Culture  Setup Time     Final   Value: 05/01/2014 14:08     Performed at [facility]   Culture     Final   Value:        BLOOD CULTURE RECEIVED NO GROWTH TO  DATE CULTURE WILL BE HELD FOR 5 DAYS BEFORE ISSUING A FINAL NEGATIVE REPORT     Performed at [facility]   Report Status PENDING   Incomplete  CULTURE, BLOOD (ROUTINE X 2)     Status: None   Collection Time    05/01/14  5:55 AM      Result Value Ref Range Status   Specimen Description BLOOD RIGHT HAND   Final   Special Requests BOTTLES DRAWN AEROBIC ONLY 10CC   Final   Culture  Setup Time     Final   Value: 05/01/2014 14:08     Performed at [facility]   Culture     Final   Value:        BLOOD CULTURE RECEIVED NO GROWTH TO DATE CULTURE WILL BE HELD FOR 5 DAYS BEFORE ISSUING A FINAL NEGATIVE REPORT     Performed at [facility]   Report Status PENDING   Incomplete  MRSA PCR SCREENING     Status: None    Collection Time    05/01/14  8:02 PM      Result Value Ref Range Status   MRSA by PCR NEGATIVE  NEGATIVE Final   Comment:            The GeneXpert MRSA Assay (FDA     approved for NASAL specimens     only), is one component of a     comprehensive MRSA colonization     surveillance program. It is not     intended to diagnose MRSA     infection nor to guide or     monitor treatment for     MRSA infections.    Studies/Results: Dg Chest 2 View  05/01/2014   CLINICAL DATA:  Cough, shortness of breath, right hand pain and swelling.  EXAM: CHEST  2 VIEW  COMPARISON:  05/03/2011  FINDINGS: Shallow inspiration. Heart size and pulmonary vascularity are mildly prominent. Mild pulmonary vascular congestion is suggested. No significant edema or consolidation. No blunting of costophrenic angles. No pneumothorax.  IMPRESSION: Mild cardiac enlargement and pulmonary vascular congestion without edema or consolidation.   Electronically Signed   By: Burman Nieves M.D.   On: 05/01/2014 02:28   Ct Head Wo Contrast  05/01/2014   CLINICAL DATA:  Hand pain coma aphasia, slurred speech.  EXAM: CT HEAD WITHOUT CONTRAST  TECHNIQUE: Contiguous axial images were obtained from the base of the skull through the vertex without intravenous contrast.  COMPARISON:  05/02/2011  FINDINGS: Diffuse cerebral atrophy. Low-attenuation changes in the deep white matter likely due to small vessel ischemia. No mass effect or midline shift. No abnormal extra-axial fluid collections. Gray-white matter junctions are distinct. Basal cisterns are not effaced. No evidence of acute intracranial hemorrhage. No depressed skull fractures. Mucosal thickening in the paranasal sinuses. No acute air-fluid levels. Mastoid air cells are not opacified.  IMPRESSION: No acute intracranial abnormalities. Chronic atrophy and small vessel ischemia.   Electronically Signed   By: Burman Nieves M.D.   On: 05/01/2014 02:13   Mr Brain Wo Contrast  05/01/2014    CLINICAL DATA:  Slurred speech.  EXAM: MRI HEAD WITHOUT CONTRAST  TECHNIQUE: Multiplanar, multiecho pulse sequences of the brain and surrounding structures were obtained without intravenous contrast.  COMPARISON:  05/01/2014 CT.  07/06/2010 MR.  FINDINGS: No acute infarct.  No intracranial hemorrhage.  No intracranial mass lesion noted on this unenhanced exam.  Mild global atrophy without hydrocephalus.  Major intracranial vascular  structures are patent.  Left-sided C2-3 facet joint degenerative changes. Degenerative changes C1-2 articulation.  Slightly heterogeneous bone marrow of the clivus without change.  Small pituitary gland stable.  Cervical medullary junction unremarkable.  Post lens replacement otherwise orbital structures unremarkable.  Minimal paranasal sinus mucosal thickening.  IMPRESSION: No acute infarct.   Electronically Signed   By: Bridgett Larsson M.D.   On: 05/01/2014 08:06   Dg Hand Complete Right  05/01/2014   CLINICAL DATA:  Right hand pain and swelling.  No reported injury.  EXAM: RIGHT HAND - COMPLETE 3+ VIEW  COMPARISON:  None.  FINDINGS: Diffuse degenerative changes throughout the interphalangeal, metacarpophalangeal, and carpal joints. No evidence of acute fracture or dislocation. The no focal bone lesion or bone erosion. Soft tissues are unremarkable.  IMPRESSION: Diffuse degenerative changes in the right hand and wrist. No displaced fractures identified. No bone erosion to suggest osteomyelitis.   Electronically Signed   By: Burman Nieves M.D.   On: 05/01/2014 02:30    Medications: Scheduled Meds: . atorvastatin  5 mg Oral QHS  . cholecalciferol  400 Units Oral Daily  . donepezil  10 mg Oral QHS  . ferrous sulfate  325 mg Oral Q breakfast  . fesoterodine  8 mg Oral Daily  . heparin  5,000 Units Subcutaneous 3 times per day  . lactulose  6.67 g Oral BID  . levothyroxine  125 mcg Oral QAC breakfast  . LORazepam  0.25 mg Oral q morning - 10a  . nystatin   Topical BID  .  pantoprazole  40 mg Oral Daily  . PARoxetine  40 mg Oral QHS  . risperiDONE  1 mg Oral BID  . sertraline  50 mg Oral Daily  . sodium chloride  3 mL Intravenous Q12H  . vancomycin  750 mg Intravenous Q12H      LOS: 2 days   RAI,RIPUDEEP M.D. [facility] 05/03/2014, 9:43 AM Pager: [phone]  If 7PM-7AM, please contact night-coverage [email or web address] Password [email or web address]  **Disclaimer: This note was dictated with voice recognition software. Similar sounding words can inadvertently be transcribed and this note may contain transcription errors which may not have been corrected upon publication of note.**

## 2014-05-07 LAB — CULTURE, BLOOD (ROUTINE X 2)
CULTURE: NO GROWTH
Culture: NO GROWTH

## 2014-08-26 ENCOUNTER — Emergency Department: Payer: Self-pay | Admitting: Emergency Medicine

## 2014-08-26 LAB — COMPREHENSIVE METABOLIC PANEL
ANION GAP: 10 (ref 7–16)
AST: 18 U/L (ref 15–37)
Albumin: 3 g/dL — ABNORMAL LOW (ref 3.4–5.0)
Alkaline Phosphatase: 76 U/L
BUN: 19 mg/dL — ABNORMAL HIGH (ref 7–18)
Bilirubin,Total: 0.4 mg/dL (ref 0.2–1.0)
CALCIUM: 8.4 mg/dL — AB (ref 8.5–10.1)
CO2: 25 mmol/L (ref 21–32)
CREATININE: 1.08 mg/dL (ref 0.60–1.30)
Chloride: 107 mmol/L (ref 98–107)
EGFR (African American): >60
EGFR (Non-African Amer.): >60
Glucose: 121 mg/dL — ABNORMAL HIGH (ref 65–99)
OSMOLALITY: 287 (ref 275–301)
POTASSIUM: 3.8 mmol/L (ref 3.5–5.1)
SGPT (ALT): 15 U/L
Sodium: 142 mmol/L (ref 136–145)
Total Protein: 7.2 g/dL (ref 6.4–8.2)

## 2014-08-26 LAB — PROTIME-INR
INR: 1
PROTHROMBIN TIME: 13 s (ref 11.5–14.7)

## 2014-08-26 LAB — CK TOTAL AND CKMB (NOT AT ARMC)
CK, Total: 35 U/L — ABNORMAL LOW (ref 39–308)
CK-MB: 0.9 ng/mL (ref 0.5–3.6)

## 2014-08-26 LAB — CBC WITH DIFFERENTIAL/PLATELET
Basophil #: 0 10*3/uL (ref 0.0–0.1)
Basophil %: 0.3 %
EOS ABS: 0.3 10*3/uL (ref 0.0–0.7)
Eosinophil %: 3 %
HCT: 34.8 % — AB (ref 40.0–52.0)
HGB: 11.9 g/dL — ABNORMAL LOW (ref 13.0–18.0)
Lymphocyte #: 2.2 10*3/uL (ref 1.0–3.6)
Lymphocyte %: 23.2 %
MCH: 31.6 pg (ref 26.0–34.0)
MCHC: 34.1 g/dL (ref 32.0–36.0)
MCV: 93 fL (ref 80–100)
Monocyte #: 0.9 x10 3/mm (ref 0.2–1.0)
Monocyte %: 9.9 %
NEUTROS ABS: 6.1 10*3/uL (ref 1.4–6.5)
NEUTROS PCT: 63.6 %
Platelet: 276 10*3/uL (ref 150–440)
RBC: 3.76 10*6/uL — AB (ref 4.40–5.90)
RDW: 13.5 % (ref 11.5–14.5)
WBC: 9.6 10*3/uL (ref 3.8–10.6)

## 2014-08-26 LAB — TROPONIN I: Troponin-I: <0.02 ng/mL

## 2015-05-07 ENCOUNTER — Inpatient Hospital Stay
Admission: EM | Admit: 2015-05-07 | Discharge: 2015-05-10 | DRG: 438 | Disposition: A | Discharge status: Skilled Nursing Facility | Payer: Medicare Other | Attending: Internal Medicine | Admitting: Internal Medicine

## 2015-05-07 ENCOUNTER — Encounter: Payer: Self-pay | Admitting: Emergency Medicine

## 2015-05-07 ENCOUNTER — Emergency Department: Payer: Medicare Other

## 2015-05-07 DIAGNOSIS — Z809 Family history of malignant neoplasm, unspecified: Secondary | ICD-10-CM

## 2015-05-07 DIAGNOSIS — K8500 Idiopathic acute pancreatitis without necrosis or infection: Secondary | ICD-10-CM

## 2015-05-07 DIAGNOSIS — E039 Hypothyroidism, unspecified: Secondary | ICD-10-CM | POA: Diagnosis present

## 2015-05-07 DIAGNOSIS — F0280 Dementia in other diseases classified elsewhere without behavioral disturbance: Secondary | ICD-10-CM | POA: Diagnosis present

## 2015-05-07 DIAGNOSIS — Z87891 Personal history of nicotine dependence: Secondary | ICD-10-CM | POA: Diagnosis not present

## 2015-05-07 DIAGNOSIS — K219 Gastro-esophageal reflux disease without esophagitis: Secondary | ICD-10-CM | POA: Diagnosis present

## 2015-05-07 DIAGNOSIS — E860 Dehydration: Secondary | ICD-10-CM | POA: Diagnosis present

## 2015-05-07 DIAGNOSIS — Z66 Do not resuscitate: Secondary | ICD-10-CM | POA: Diagnosis present

## 2015-05-07 DIAGNOSIS — E785 Hyperlipidemia, unspecified: Secondary | ICD-10-CM | POA: Diagnosis present

## 2015-05-07 DIAGNOSIS — G309 Alzheimer's disease, unspecified: Secondary | ICD-10-CM | POA: Diagnosis present

## 2015-05-07 DIAGNOSIS — J189 Pneumonia, unspecified organism: Secondary | ICD-10-CM | POA: Diagnosis present

## 2015-05-07 DIAGNOSIS — Z79899 Other long term (current) drug therapy: Secondary | ICD-10-CM

## 2015-05-07 DIAGNOSIS — L8990 Pressure ulcer of unspecified site, unspecified stage: Secondary | ICD-10-CM | POA: Insufficient documentation

## 2015-05-07 DIAGNOSIS — K859 Acute pancreatitis without necrosis or infection, unspecified: Secondary | ICD-10-CM

## 2015-05-07 DIAGNOSIS — H6690 Otitis media, unspecified, unspecified ear: Secondary | ICD-10-CM | POA: Diagnosis present

## 2015-05-07 DIAGNOSIS — E872 Acidosis: Secondary | ICD-10-CM | POA: Diagnosis present

## 2015-05-07 HISTORY — DX: Hyperlipidemia, unspecified: E78.5

## 2015-05-07 HISTORY — DX: Hypothyroidism, unspecified: E03.9

## 2015-05-07 LAB — COMPREHENSIVE METABOLIC PANEL
ALT: 114 U/L — ABNORMAL HIGH (ref 17–63)
AST: 47 U/L — ABNORMAL HIGH (ref 15–41)
Albumin: 2.7 g/dL — ABNORMAL LOW (ref 3.5–5.0)
Alkaline Phosphatase: 371 U/L — ABNORMAL HIGH (ref 38–126)
Anion gap: 11 (ref 5–15)
BUN: 36 mg/dL — AB (ref 6–20)
CHLORIDE: 100 mmol/L — AB (ref 101–111)
CO2: 27 mmol/L (ref 22–32)
CREATININE: 1.28 mg/dL — AB (ref 0.61–1.24)
Calcium: 9.2 mg/dL (ref 8.9–10.3)
GFR calc Af Amer: 57 mL/min — ABNORMAL LOW (ref >60)
GFR calc non Af Amer: 49 mL/min — ABNORMAL LOW (ref >60)
GLUCOSE: 104 mg/dL — AB (ref 65–99)
Potassium: 3.8 mmol/L (ref 3.5–5.1)
Sodium: 138 mmol/L (ref 135–145)
TOTAL PROTEIN: 7 g/dL (ref 6.5–8.1)
Total Bilirubin: 1.4 mg/dL — ABNORMAL HIGH (ref 0.3–1.2)

## 2015-05-07 LAB — URINALYSIS COMPLETE WITH MICROSCOPIC (ARMC ONLY)
BILIRUBIN URINE: NEGATIVE
Glucose, UA: NEGATIVE mg/dL
Hgb urine dipstick: NEGATIVE
Ketones, ur: NEGATIVE mg/dL
LEUKOCYTES UA: NEGATIVE
NITRITE: NEGATIVE
PH: 5 (ref 5.0–8.0)
Protein, ur: 30 mg/dL — AB
Specific Gravity, Urine: 1.021 (ref 1.005–1.030)
Squamous Epithelial / LPF: NONE SEEN

## 2015-05-07 LAB — CBC WITH DIFFERENTIAL/PLATELET
Basophils Absolute: 0 10*3/uL (ref 0–0.1)
Basophils Relative: 0 %
Eosinophils Absolute: 0 10*3/uL (ref 0–0.7)
Eosinophils Relative: 0 %
HEMATOCRIT: 36.1 % — AB (ref 40.0–52.0)
Hemoglobin: 11.9 g/dL — ABNORMAL LOW (ref 13.0–18.0)
Lymphocytes Relative: 6 %
Lymphs Abs: 1 10*3/uL (ref 1.0–3.6)
MCH: 29.4 pg (ref 26.0–34.0)
MCHC: 32.8 g/dL (ref 32.0–36.0)
MCV: 89.6 fL (ref 80.0–100.0)
MONOS PCT: 11 %
Monocytes Absolute: 2 10*3/uL — ABNORMAL HIGH (ref 0.2–1.0)
Neutro Abs: 15.6 10*3/uL — ABNORMAL HIGH (ref 1.4–6.5)
Neutrophils Relative %: 83 %
Platelets: 371 10*3/uL (ref 150–440)
RBC: 4.03 MIL/uL — AB (ref 4.40–5.90)
RDW: 12.7 % (ref 11.5–14.5)
WBC: 18.6 10*3/uL — AB (ref 3.8–10.6)

## 2015-05-07 LAB — LACTIC ACID, PLASMA
LACTIC ACID, VENOUS: 2.4 mmol/L — AB (ref 0.5–2.0)
LACTIC ACID, VENOUS: 0.9 mmol/L (ref 0.5–2.0)

## 2015-05-07 LAB — LIPASE, BLOOD: Lipase: 32 U/L (ref 22–51)

## 2015-05-07 LAB — TROPONIN I

## 2015-05-07 LAB — AMMONIA: AMMONIA: 48 umol/L — AB (ref 9–35)

## 2015-05-07 LAB — TSH: TSH: 0.835 u[IU]/mL (ref 0.350–4.500)

## 2015-05-07 MED ORDER — HEPARIN SODIUM (PORCINE) 5000 UNIT/ML IJ SOLN
5000.0000 [IU] | Freq: Three times a day (TID) | INTRAMUSCULAR | Status: DC
  Administered 2015-05-07 – 2015-05-10 (×8): 5000 [IU] via SUBCUTANEOUS
  Filled 2015-05-07 (×8): qty 1

## 2015-05-07 MED ORDER — SODIUM CHLORIDE 0.9 % IV SOLN
Freq: Once | INTRAVENOUS | Status: AC
  Administered 2015-05-07: 18:00:00 via INTRAVENOUS

## 2015-05-07 MED ORDER — SODIUM CHLORIDE 0.9 % IV SOLN
Freq: Once | INTRAVENOUS | Status: AC
  Administered 2015-05-07: 16:00:00 via INTRAVENOUS

## 2015-05-07 MED ORDER — IOHEXOL 240 MG/ML SOLN
25.0000 mL | Freq: Once | INTRAMUSCULAR | Status: AC | PRN
  Administered 2015-05-07: 25 mL via ORAL

## 2015-05-07 MED ORDER — SODIUM CHLORIDE 0.9 % IV SOLN
INTRAVENOUS | Status: AC
  Administered 2015-05-07 – 2015-05-08 (×2): via INTRAVENOUS

## 2015-05-07 MED ORDER — ONDANSETRON HCL 4 MG PO TABS: 4.0000 mg | ORAL_TABLET | Freq: Four times a day (QID) | ORAL | Status: DC | PRN

## 2015-05-07 MED ORDER — PANTOPRAZOLE SODIUM 40 MG IV SOLR
40.0000 mg | INTRAVENOUS | Status: DC
  Administered 2015-05-07 – 2015-05-09 (×3): 40 mg via INTRAVENOUS
  Filled 2015-05-07 (×3): qty 40

## 2015-05-07 MED ORDER — ONDANSETRON HCL 4 MG/2ML IJ SOLN: 4.0000 mg | Freq: Four times a day (QID) | INTRAMUSCULAR | Status: DC | PRN

## 2015-05-07 MED ORDER — SODIUM CHLORIDE 0.9 % IV BOLUS (SEPSIS): 1000.0000 mL | Freq: Once | INTRAVENOUS | Status: DC

## 2015-05-07 MED ORDER — MORPHINE SULFATE 2 MG/ML IJ SOLN: 1.0000 mg | INTRAMUSCULAR | Status: DC | PRN

## 2015-05-07 MED ORDER — ONDANSETRON HCL 4 MG/2ML IJ SOLN
4.0000 mg | Freq: Once | INTRAMUSCULAR | Status: AC
  Administered 2015-05-07: 4 mg via INTRAVENOUS

## 2015-05-07 MED ORDER — ONDANSETRON HCL 4 MG/2ML IJ SOLN
INTRAMUSCULAR | Status: AC
  Administered 2015-05-07: 4 mg via INTRAVENOUS
  Filled 2015-05-07: qty 2

## 2015-05-07 MED ORDER — IOHEXOL 300 MG/ML  SOLN
80.0000 mL | Freq: Once | INTRAMUSCULAR | Status: AC | PRN
  Administered 2015-05-07: 80 mL via INTRAVENOUS

## 2015-05-07 NOTE — ED Notes (Signed)
Pt arrived to ED via [facility] from [facility].  Pt with history of alheimer's/dementia.  EMS was called for "weakness", but was also told that patient had not had much PO intake in the last few days and that he had been vomiting some.  In EMS truck, pt was in SVT with HR in 160's and SBP in 80's, and NS bolus hung ( in by the time pt arrived to ED).  On ED arrival, pt HR in 110's & pt in SR.  BP stable.  Pt not oriented to self, place.  SpO2 92-93% and pt placed on 2L n/c.

## 2015-05-07 NOTE — ED Provider Notes (Signed)
[facility] Emergency Department Provider Note  ____________________________________________  Time seen: Approximately 3:39 PM  I have reviewed the triage vital signs and the nursing notes.   HISTORY  Chief Complaint Weakness  History limited by patient's dementia and the apparent history of being less responsive than usual  HPI Corey Miller is a 81 y.o. male patient reported by his lunch partners at the [facility] nursing home to be not eating much for the last few days and just drinking orange juice and coffee. Staff there reports she's been vomiting. But cannot say how often to not sensitive seen him vomit or has vomit on his clothes are wet per EMS. Patient really cannot give me a good history at all.   Past Medical History  Diagnosis Date  . Hypothyroidism   . Dementia   . Hyperlipidemia     Patient Active Problem List   Diagnosis Date Noted  . Acute pancreatitis 05/07/2015    History reviewed. No pertinent past surgical history.  Current Outpatient Rx  Name  Route  Sig  Dispense  Refill  . atorvastatin (LIPITOR) 10 MG tablet   Oral   Take 10 mg by mouth at bedtime.         . cholecalciferol (VITAMIN D) 400 UNITS TABS tablet   Oral   Take 400 Units by mouth daily.         Marland Kitchen donepezil (ARICEPT) 10 MG tablet   Oral   Take 10 mg by mouth at bedtime.         . ferrous sulfate 325 (65 FE) MG tablet   Oral   Take 325 mg by mouth daily with breakfast.         . fesoterodine (TOVIAZ) 8 MG TB24 tablet   Oral   Take 8 mg by mouth daily.         Marland Kitchen levothyroxine (SYNTHROID, LEVOTHROID) 125 MCG tablet   Oral   Take 125 mcg by mouth daily before breakfast.         . LORazepam (ATIVAN) 0.5 MG tablet   Oral   Take 0.25 mg by mouth daily.         . Melatonin 5 MG TABS   Oral   Take 1 tablet by mouth at bedtime.         . ondansetron (ZOFRAN) 4 MG tablet   Oral   Take 4 mg by mouth every 6 (six) hours as needed for nausea  or vomiting.         . ranitidine (ZANTAC) 150 MG tablet   Oral   Take 150 mg by mouth 2 (two) times daily.         . risperiDONE (RISPERDAL) 0.5 MG tablet   Oral   Take 0.5-1 mg by mouth 2 (two) times daily. Pt takes one tablet in the morning and two tablets at bedtime.         . sertraline (ZOLOFT) 50 MG tablet   Oral   Take 50 mg by mouth at bedtime.           Allergies Review of patient's allergies indicates no known allergies.  Family History  Problem Relation Age of Onset  . Cancer Sister     Social History History  Substance Use Topics  . Smoking status: Former Games developer  . Smokeless tobacco: Not on file  . Alcohol Use: No    Review of Systems Constitutional: No fever/chills Eyes: No visual changes. ENT: No sore throat. Cardiovascular:  Denies chest pain. Respiratory: Denies shortness of breath. Gastrointestinal: No abdominal pain.  No nausea, no vomiting.  No diarrhea.  No constipation. Genitourinary: Negative for dysuria. Musculoskeletal: Negative for back pain. Skin: Negative for rash. Neurological: Negative for headaches, focal weakness or numbness.  10-point ROS otherwise negative.  ____________________________________________   PHYSICAL EXAM:  VITAL SIGNS: ED Triage Vitals  Enc Vitals Group     BP --      Pulse --      Resp --      Temp --      Temp src --      SpO2 05/07/15 1532 92 %     Weight --      Height --      Head Cir --      Peak Flow --      Pain Score --      Pain Loc --      Pain Edu? --      Excl. in GC? --    Constitutional: Alert . Well appearing and in no acute distress. Eyes: Conjunctivae are normal. PERRL. EOMI. Head: Atraumatic. Nose: No congestion/rhinnorhea. Mouth/Throat: Mucous membranes are moist.  Oropharynx non-erythematous. Neck: No stridor.  Cardiovascular: Tachycardic, regular rhythm. Grossly normal heart sounds.  Good peripheral circulation. Respiratory: Normal respiratory effort.  No  retractions. Lungs CTAB. Gastrointestinal: Abdomen is firm or that I would expect and minimally larger expect not particularly distended . No abdominal bruits. No CVA tenderness. Musculoskeletal: No lower extremity tenderness nor edema.  No joint effusions. Neurologic: Patient is awake alert and moves all extremities doesn't say very much except for yes or no in response to questions Skin:  Skin is warm, dry and intact. No rash noted.   ____________________________________________   LABS (all labs ordered are listed, but only abnormal results are displayed)  Labs Reviewed  COMPREHENSIVE METABOLIC PANEL - Abnormal; Notable for the following:    Chloride 100 (*)    Glucose, Bld 104 (*)    BUN 36 (*)    Creatinine, Ser 1.28 (*)    Albumin 2.7 (*)    AST 47 (*)    ALT 114 (*)    Alkaline Phosphatase 371 (*)    Total Bilirubin 1.4 (*)    GFR calc non Af Amer 49 (*)    GFR calc Af Amer 57 (*)    All other components within normal limits  LACTIC ACID, PLASMA - Abnormal; Notable for the following:    Lactic Acid, Venous 2.4 (*)    All other components within normal limits  CBC WITH DIFFERENTIAL/PLATELET - Abnormal; Notable for the following:    WBC 18.6 (*)    RBC 4.03 (*)    Hemoglobin 11.9 (*)    HCT 36.1 (*)    Neutro Abs 15.6 (*)    Monocytes Absolute 2.0 (*)    All other components within normal limits  URINALYSIS COMPLETEWITH MICROSCOPIC ([facility] ONLY) - Abnormal; Notable for the following:    Color, Urine AMBER (*)    APPearance CLEAR (*)    Protein, ur 30 (*)    Bacteria, UA RARE (*)    All other components within normal limits  AMMONIA - Abnormal; Notable for the following:    Ammonia 48 (*)    All other components within normal limits  TROPONIN I  TSH  LACTIC ACID, PLASMA  LIPASE, BLOOD   ____________________________________________  EKG  EKG shows SVT at a rate of 154. Normal axis no other changes. When  I walked in room patient's heart rate is 1:15 to 108. He  is now just sinus tachycardia. ____________________________________________  RADIOLOGY  CT shows inflammation along the pancreas. ____________________________________________   PROCEDURES    ____________________________________________   INITIAL IMPRESSION / ASSESSMENT AND PLAN / ED COURSE  Pertinent labs & imaging results that were available during my care of the patient were reviewed by me and considered in my medical decision making (see chart for details).  ____________________________________________   FINAL CLINICAL IMPRESSION(S) / ED DIAGNOSES  Final diagnoses:  Idiopathic acute pancreatitis   Exam of the patient 3 ordering labs consulting with the hospitalist Accounts for 15 minutes critical care time   Arnaldo Natal, MD 05/07/15 1839

## 2015-05-07 NOTE — H&P (Signed)
[facility] - [place] [facility]   PATIENT NAME: Corey Miller    MR#:  [id number]  DATE OF BIRTH:  1928/05/01  DATE OF ADMISSION:  05/07/2015  PRIMARY CARE PHYSICIAN: [facility]   REQUESTING/REFERRING PHYSICIAN: Dorothea Glassman  CHIEF COMPLAINT:   Chief Complaint  Patient presents with  . Weakness    HISTORY OF PRESENT ILLNESS: Corey Miller  is a 81 y.o. male with a known history of hypothyroidism, dementia, hyperlipidemia lives in [place] at [place], his niece visited him today and found him not looking himself slightly drowsy. She also spoke to his room partners and they told that he has been vomiting for the last 2 days she went and spoke to the staff over there and was very upset with them and demanded him to be seen by Doctor. Finally they transferred him to emergency room for further evaluation. Patient denied any complaints but this all history obtained from his niece as he is slightly drowsy and hard of hearing.   PAST MEDICAL HISTORY:   Past Medical History  Diagnosis Date  . Hypothyroidism   . Dementia   . Hyperlipidemia     PAST SURGICAL HISTORY: History reviewed. No pertinent past surgical history.  SOCIAL HISTORY:  History  Substance Use Topics  . Smoking status: Former Games developer  . Smokeless tobacco: Not on file  . Alcohol Use: No    FAMILY HISTORY:  Family History  Problem Relation Age of Onset  . Cancer Sister     DRUG ALLERGIES: No Known Allergies  REVIEW OF SYSTEMS:   Pt is drowsy. Not able to provide.  MEDICATIONS AT HOME:  Prior to Admission medications   Medication Sig Start Date End Date Taking? Authorizing Provider  atorvastatin (LIPITOR) 10 MG tablet Take 10 mg by mouth at bedtime.   Yes Historical Provider, MD  cholecalciferol (VITAMIN D) 400 UNITS TABS tablet Take 400 Units by mouth daily.   Yes Historical Provider, MD  donepezil (ARICEPT) 10 MG tablet Take 10 mg by mouth at bedtime.   Yes Historical  Provider, MD  ferrous sulfate 325 (65 FE) MG tablet Take 325 mg by mouth daily with breakfast.   Yes Historical Provider, MD  fesoterodine (TOVIAZ) 8 MG TB24 tablet Take 8 mg by mouth daily.   Yes Historical Provider, MD  levothyroxine (SYNTHROID, LEVOTHROID) 125 MCG tablet Take 125 mcg by mouth daily before breakfast.   Yes Historical Provider, MD  LORazepam (ATIVAN) 0.5 MG tablet Take 0.25 mg by mouth daily.   Yes Historical Provider, MD  Melatonin 5 MG TABS Take 1 tablet by mouth at bedtime.   Yes Historical Provider, MD  ondansetron (ZOFRAN) 4 MG tablet Take 4 mg by mouth every 6 (six) hours as needed for nausea or vomiting.   Yes Historical Provider, MD  ranitidine (ZANTAC) 150 MG tablet Take 150 mg by mouth 2 (two) times daily.   Yes Historical Provider, MD  risperiDONE (RISPERDAL) 0.5 MG tablet Take 0.5-1 mg by mouth 2 (two) times daily. Pt takes one tablet in the morning and two tablets at bedtime.   Yes Historical Provider, MD  sertraline (ZOLOFT) 50 MG tablet Take 50 mg by mouth at bedtime.   Yes Historical Provider, MD      PHYSICAL EXAMINATION:   VITAL SIGNS: Blood pressure 118/68, pulse 107, resp. rate 26, height 5\' 9"  (1.753 m), weight 75.297 kg (166 lb), SpO2 95 %.  GENERAL:  81 y.o.-year-old patient lying in the bed  with no acute distress.  EYES: Pupils equal, round, reactive to light and accommodation. No scleral icterus. Extraocular muscles intact.  HEENT: Head atraumatic, normocephalic. Oropharynx and nasopharynx clear. Dry mucosa. NECK:  Supple, no jugular venous distention. No thyroid enlargement, no tenderness.  LUNGS: Normal breath sounds bilaterally, no wheezing, rales,rhonchi or crepitation. No use of accessory muscles of respiration.  CARDIOVASCULAR: S1, S2 normal. No murmurs, rubs, or gallops.  ABDOMEN: Soft, nontender, mild distended. Bowel sounds present. No organomegaly or mass.  EXTREMITIES: No pedal edema, cyanosis, or clubbing.  NEUROLOGIC: Cranial nerves not  checked due to drowsiness. Muscle strength 3-4/5 in all extremities.  Gait not checked.  PSYCHIATRIC: The patient is drowsy but arosable.  SKIN: No obvious rash, lesion, or ulcer.   LABORATORY PANEL:   CBC  Recent Labs Lab 05/07/15 1552  WBC 18.6*  HGB 11.9*  HCT 36.1*  PLT 371  MCV 89.6  MCH 29.4  MCHC 32.8  RDW 12.7  LYMPHSABS 1.0  MONOABS 2.0*  EOSABS 0.0  BASOSABS 0.0   ------------------------------------------------------------------------------------------------------------------  Chemistries   Recent Labs Lab 05/07/15 1552  NA 138  K 3.8  CL 100*  CO2 27  GLUCOSE 104*  BUN 36*  CREATININE 1.28*  CALCIUM 9.2  AST 47*  ALT 114*  ALKPHOS 371*  BILITOT 1.4*   ------------------------------------------------------------------------------------------------------------------ estimated creatinine clearance is 42.2 mL/min (by C-G formula based on Cr of 1.28). ------------------------------------------------------------------------------------------------------------------  Recent Labs  05/07/15 1552  TSH 0.835    Lipase- awaited result.  Cardiac Enzymes  Recent Labs Lab 05/07/15 1552  TROPONINI <0.03    Urinalysis    Component Value Date/Time   COLORURINE AMBER* 05/07/2015 1552   APPEARANCEUR CLEAR* 05/07/2015 1552   LABSPEC 1.021 05/07/2015 1552   PHURINE 5.0 05/07/2015 1552   GLUCOSEU NEGATIVE 05/07/2015 1552   HGBUR NEGATIVE 05/07/2015 1552   BILIRUBINUR NEGATIVE 05/07/2015 1552   KETONESUR NEGATIVE 05/07/2015 1552   PROTEINUR 30* 05/07/2015 1552   NITRITE NEGATIVE 05/07/2015 1552   LEUKOCYTESUR NEGATIVE 05/07/2015 1552     RADIOLOGY: Ct Head Wo Contrast  05/07/2015   CLINICAL DATA:  Weakness beginning today. History of Alzheimer's disease scratch the history of dementia.  EXAM: CT HEAD WITHOUT CONTRAST  TECHNIQUE: Contiguous axial images were obtained from the base of the skull through the vertex without intravenous contrast.   COMPARISON:  Head CT scan 08/26/2014.  Brain MRI 05/01/2014.  FINDINGS: There is some cortical atrophy. No evidence of acute intracranial abnormality including hemorrhage, infarct, mass lesion, mass effect, midline shift or abnormal extra-axial fluid collection is identified. No hydrocephalus or pneumocephalus. A very short air-fluid level is noted in the right maxillary sinus. The calvarium is intact.  IMPRESSION: No acute abnormality.  Mild atrophy.   Electronically Signed   By: Drusilla Kanner M.D.   On: 05/07/2015 17:39   Ct Abdomen Pelvis W Contrast  05/07/2015   CLINICAL DATA:  Acute onset of generalized weakness. Vomiting. Decreased oral intake. Tachycardia and hypotension. Initial encounter.  EXAM: CT ABDOMEN AND PELVIS WITH CONTRAST  TECHNIQUE: Multidetector CT imaging of the abdomen and pelvis was performed using the standard protocol following bolus administration of intravenous contrast.  CONTRAST:  80mL OMNIPAQUE IOHEXOL 300 MG/ML  SOLN  COMPARISON:  CT of the abdomen and pelvis from 05/03/2011  FINDINGS: Bibasilar opacities may reflect atelectasis or possibly mild pneumonia. Diffuse coronary artery calcifications are seen. Calcification is noted at the aortic valve. Scattered calcified mediastinal nodes are noted, likely reflecting remote granulomatous disease. There is  mild wall thickening along the distal esophagus, stable from 2012 and likely reflecting the patient's baseline. A small hiatal hernia is seen.  There is diffuse soft tissue inflammation about the entirety of the pancreas, compatible with acute pancreatitis. There is no definite evidence of devascularization or pseudocyst formation at this time. Soft tissue inflammation extends inferiorly along the mesentery, and about the adjacent duodenum.  Scattered nonspecific hypodensities are seen within the liver, measuring up to 1.3 cm in size. These are grossly stable from 2012 and likely benign. The spleen is unremarkable in appearance. The  gallbladder is contracted and not well assessed, though it does contain multiple stones. There is no definite evidence for obstruction or cholecystitis. The adrenal glands are unremarkable in appearance.  Prominent left renal parapelvic cysts are seen. A few tiny left renal stones are seen, measuring up to 2 mm in size. Mild nonspecific stranding is noted bilaterally. The kidneys are otherwise unremarkable. There is no evidence of hydronephrosis. No obstructing ureteral stones are seen.  No free fluid is identified. The small bowel is unremarkable in appearance. The stomach is within normal limits. No acute vascular abnormalities are seen. Mild calcification is noted at the origin of the renal arteries bilaterally, more prominent on the right.  The appendix is not definitely seen; there is no evidence of appendicitis. The sigmoid colon is mildly redundant. The colon is unremarkable in appearance.  The bladder is mildly distended and grossly unremarkable. The prostate is borderline normal in size, with minimal calcification. There is impression on the base of the bladder by the prostate, similar in appearance to 2012. No inguinal lymphadenopathy is seen.  No acute osseous abnormalities are identified. Scattered anterior osteophytes are seen along the lower thoracic and lumbar spine. Underlying facet disease is noted along the lumbar spine.  IMPRESSION: 1. Diffuse soft tissue inflammation about the entirety of the pancreas, compatible with acute pancreatitis. No definite evidence of devascularization or pseudocyst formation at this time. Soft tissue inflammation extends inferiorly along the mesentery, and about the adjacent duodenum. 2. Bibasilar airspace opacities may reflect atelectasis or possibly mild pneumonia. 3. Mild chronic wall thickening along the distal esophagus, stable from 2012. 4. Cholelithiasis; gallbladder contracted and not well assessed, though there is no evidence for obstruction or cholecystitis.  5. Few tiny left renal stones noted. Prominent left renal parapelvic cysts seen. 6. Diffuse coronary artery calcifications seen. Calcification noted at the aortic valve. 7. Scattered calcified mediastinal nodes likely reflect remote granulomatous disease. 8. Small hiatal hernia noted. 9. Nonspecific hypodensities within the liver, measuring up to 1.3 cm in size. These are grossly stable from 2012 and likely benign.   Electronically Signed   By: Roanna Raider M.D.   On: 05/07/2015 17:45   Dg Chest Portable 1 View  05/07/2015   CLINICAL DATA:  Weakness  EXAM: PORTABLE CHEST - 1 VIEW  COMPARISON:  08/26/2014  FINDINGS: Mild prominence of the cardiomediastinal silhouette is noted without evidence for edema. Trace left pleural fluid or thickening reidentified. Allowing for portable technique, no focal airspace opacity. No acute osseous abnormality.  IMPRESSION: No acute cardiopulmonary process. If symptoms persist, consider PA and lateral chest radiographs obtained at full inspiration when the patient is clinically able.   Electronically Signed   By: Christiana Pellant M.D.   On: 05/07/2015 16:11    IMPRESSION AND PLAN:  * Acute pancreatitis  Keep him on clear liquid diet if he is able to eat any, give IV fluids and symptomatic management for  nausea and pain.  We will wait for lipase result.  Because of his old age, this pancreatitis episode might result in severe mortality or morbidity. I explained that to his niece who is also healthcare power of attorney.  * Lactic acidosis  This is due to his dehydration and pancreatitis, vitals stable, continue IV fluid and recheck tomorrow morning.  * GERD   Protonix IV.  * Hypothyroidism   Hold oral meds for now.  * Dementia   Hold meds for now- until start eating.   All the records are reviewed and case discussed with ED provider. Management plans discussed with the patient, family and they are in agreement.  CODE STATUS: Advance Directive Documentation         Most Recent Value   Type of Advance Directive  -- [Pt has DNR paperwork present]   Pre-existing out of facility DNR order (yellow form or pink MOST form)     "MOST" Form in Place?         TOTAL TIME TAKING CARE OF THIS PATIENT: 50 minutes.    Altamese Dilling M.D on 05/07/2015   Between 7am to 6pm - Pager - [phone]  After 6pm go to [email or web address] - password [email or web address] [facility]  [address] [facility]  Office  [phone]  CC: Primary care physician; [facility] [address]

## 2015-05-08 DIAGNOSIS — L8990 Pressure ulcer of unspecified site, unspecified stage: Secondary | ICD-10-CM | POA: Insufficient documentation

## 2015-05-08 LAB — CBC
HCT: 36.1 % — ABNORMAL LOW (ref 40.0–52.0)
Hemoglobin: 12 g/dL — ABNORMAL LOW (ref 13.0–18.0)
MCH: 30.2 pg (ref 26.0–34.0)
MCHC: 33.1 g/dL (ref 32.0–36.0)
MCV: 91.3 fL (ref 80.0–100.0)
Platelets: 343 10*3/uL (ref 150–440)
RBC: 3.96 MIL/uL — AB (ref 4.40–5.90)
RDW: 12.8 % (ref 11.5–14.5)
WBC: 17.3 10*3/uL — ABNORMAL HIGH (ref 3.8–10.6)

## 2015-05-08 LAB — BASIC METABOLIC PANEL
ANION GAP: 8 (ref 5–15)
BUN: 25 mg/dL — ABNORMAL HIGH (ref 6–20)
CO2: 26 mmol/L (ref 22–32)
CREATININE: 0.76 mg/dL (ref 0.61–1.24)
Calcium: 8.7 mg/dL — ABNORMAL LOW (ref 8.9–10.3)
Chloride: 108 mmol/L (ref 101–111)
GFR calc non Af Amer: >60 mL/min (ref >60)
Glucose, Bld: 76 mg/dL (ref 65–99)
Potassium: 3.7 mmol/L (ref 3.5–5.1)
SODIUM: 142 mmol/L (ref 135–145)

## 2015-05-08 LAB — LACTIC ACID, PLASMA: LACTIC ACID, VENOUS: 0.9 mmol/L (ref 0.5–2.0)

## 2015-05-08 MED ORDER — LEVOTHYROXINE SODIUM 125 MCG PO TABS
125.0000 ug | ORAL_TABLET | ORAL | Status: DC
  Administered 2015-05-08 – 2015-05-10 (×3): 125 ug via ORAL
  Filled 2015-05-08 (×3): qty 1

## 2015-05-08 MED ORDER — LEVOFLOXACIN 750 MG PO TABS
750.0000 mg | ORAL_TABLET | Freq: Every day | ORAL | Status: DC
  Administered 2015-05-08 – 2015-05-10 (×3): 750 mg via ORAL
  Filled 2015-05-08 (×3): qty 1

## 2015-05-08 MED ORDER — LORAZEPAM 0.5 MG PO TABS
0.2500 mg | ORAL_TABLET | Freq: Every day | ORAL | Status: DC
  Administered 2015-05-08 – 2015-05-10 (×3): 0.25 mg via ORAL
  Filled 2015-05-08 (×3): qty 1

## 2015-05-08 MED ORDER — RISPERIDONE 1 MG PO TABS
1.0000 mg | ORAL_TABLET | Freq: Every day | ORAL | Status: DC
  Administered 2015-05-08 – 2015-05-09 (×2): 1 mg via ORAL
  Filled 2015-05-08 (×2): qty 1

## 2015-05-08 MED ORDER — SERTRALINE HCL 50 MG PO TABS
50.0000 mg | ORAL_TABLET | Freq: Every day | ORAL | Status: DC
  Administered 2015-05-08 – 2015-05-09 (×2): 50 mg via ORAL
  Filled 2015-05-08 (×2): qty 1

## 2015-05-08 MED ORDER — RISPERIDONE 1 MG PO TABS: 0.5000 mg | ORAL_TABLET | Freq: Two times a day (BID) | ORAL | Status: DC

## 2015-05-08 MED ORDER — MELATONIN 5 MG PO TABS: 1.0000 | ORAL_TABLET | Freq: Every day | ORAL | Status: DC

## 2015-05-08 MED ORDER — FERROUS SULFATE 325 (65 FE) MG PO TABS
325.0000 mg | ORAL_TABLET | Freq: Every day | ORAL | Status: DC
Start: 2015-05-09 — End: 2015-05-10
  Administered 2015-05-09 – 2015-05-10 (×2): 325 mg via ORAL
  Filled 2015-05-08 (×2): qty 1

## 2015-05-08 MED ORDER — ENSURE ENLIVE PO LIQD
237.0000 mL | Freq: Two times a day (BID) | ORAL | Status: DC
  Administered 2015-05-08 – 2015-05-09 (×3): 237 mL via ORAL

## 2015-05-08 MED ORDER — DONEPEZIL HCL 5 MG PO TABS
10.0000 mg | ORAL_TABLET | Freq: Every day | ORAL | Status: DC
  Administered 2015-05-08 – 2015-05-09 (×2): 10 mg via ORAL
  Filled 2015-05-08 (×2): qty 2

## 2015-05-08 MED ORDER — RISPERIDONE 1 MG PO TABS
0.5000 mg | ORAL_TABLET | Freq: Every day | ORAL | Status: DC
  Administered 2015-05-08 – 2015-05-10 (×3): 0.5 mg via ORAL
  Filled 2015-05-08 (×3): qty 1

## 2015-05-08 NOTE — Plan of Care (Signed)
Problem: Discharge Progression Outcomes Goal: Discharge plan in place and appropriate Individualism Outcome: Progressing Pt comes from [facility] [address]. Niece is POA.   Arrived in ED with a stage I pressure area on sacrum. HOH. Goal: Other Discharge Outcomes/Goals Outcome: Progressing Plan of care progress to goal: Pain - pt complains of no pain Hemodynamically stable - VSS with exception of elevated HR.  Telemetry reports ST w/ PACs. Complications - pt seems to be alert at times, and other times very lethargic and sleeping Activity - pt remains in bed

## 2015-05-08 NOTE — Progress Notes (Signed)
[facility] - [place] [facility]   PATIENT NAME: Corey Miller    MR#:  [id number]  DATE OF BIRTH:  04/12/1928  SUBJECTIVE:  Patient looks at me and says he wants to get up.  REVIEW OF SYSTEMS:    Review of Systems  Unable to perform ROS: dementia    Tolerating Diet:yes     DRUG ALLERGIES:  No Known Allergies  VITALS:  Blood pressure 129/58, pulse 74, temperature 97.9 F (36.6 C), temperature source Oral, resp. rate 24, height 5\' 8"  (1.727 m), weight 75.841 kg (167 lb 3.2 oz), SpO2 96 %.  PHYSICAL EXAMINATION:   Physical Exam  Constitutional: He is well-developed, well-nourished, and in no distress. No distress.  HENT:  Head: Normocephalic.  Eyes: No scleral icterus.  Neck: Normal range of motion. Neck supple. No JVD present. No tracheal deviation present.  Cardiovascular: Normal rate, regular rhythm and normal heart sounds.  Exam reveals no gallop and no friction rub.   No murmur heard. Pulmonary/Chest: Effort normal and breath sounds normal. No respiratory distress. He has no wheezes. He has no rales. He exhibits no tenderness.  Abdominal: Soft. Bowel sounds are normal. He exhibits no distension and no mass. There is no tenderness. There is no rebound and no guarding.  Musculoskeletal: Normal range of motion. He exhibits no edema.  Neurological: He is alert.  Skin: Skin is warm. No rash noted. No erythema.  Psychiatric: Affect and judgment normal.      LABORATORY PANEL:   CBC  Recent Labs Lab 05/08/15 0311  WBC 17.3*  HGB 12.0*  HCT 36.1*  PLT 343   ------------------------------------------------------------------------------------------------------------------  Chemistries   Recent Labs Lab 05/07/15 1552 05/08/15 0311  NA 138 142  K 3.8 3.7  CL 100* 108  CO2 27 26  GLUCOSE 104* 76  BUN 36* 25*  CREATININE 1.28* 0.76  CALCIUM 9.2 8.7*  AST 47*  --   ALT 114*  --   ALKPHOS 371*  --   BILITOT 1.4*  --     ------------------------------------------------------------------------------------------------------------------  Cardiac Enzymes  Recent Labs Lab 05/07/15 1552  TROPONINI <0.03   ------------------------------------------------------------------------------------------------------------------  RADIOLOGY:  Ct Head Wo Contrast  05/07/2015     IMPRESSION: No acute abnormality.  Mild atrophy.   Electronically Signed   By: Drusilla Kanner M.D.   On: 05/07/2015 17:39   Ct Abdomen Pelvis W Contrast  05/07/2015   C IMPRESSION: 1. Diffuse soft tissue inflammation about the entirety of the pancreas, compatible with acute pancreatitis. No definite evidence of devascularization or pseudocyst formation at this time. Soft tissue inflammation extends inferiorly along the mesentery, and about the adjacent duodenum. 2. Bibasilar airspace opacities may reflect atelectasis or possibly mild pneumonia. 3. Mild chronic wall thickening along the distal esophagus, stable from 2012. 4. Cholelithiasis; gallbladder contracted and not well assessed, though there is no evidence for obstruction or cholecystitis. 5. Few tiny left renal stones noted. Prominent left renal parapelvic cysts seen. 6. Diffuse coronary artery calcifications seen. Calcification noted at the aortic valve. 7. Scattered calcified mediastinal nodes likely reflect remote granulomatous disease. 8. Small hiatal hernia noted. 9. Nonspecific hypodensities within the liver, measuring up to 1.3 cm in size. These are grossly stable from 2012 and likely benign.   Electronically Signed   By: Roanna Raider M.D.   On: 05/07/2015 17:45   Dg Chest Portable 1 View  05/07/2015   C  IMPRESSION: No acute cardiopulmonary process. If symptoms persist, consider PA and lateral  chest radiographs obtained at full inspiration when the patient is clinically able.   Electronically Signed   By: Christiana Pellant M.D.   On: 05/07/2015 16:11     ASSESSMENT AND PLAN:    81 year old male with a history of dementia and hypothyroidism who is at the [place] who presented with weakness and nausea and incidentally found to have acute pink otitis on CT scan along with pneumonia.  1. Acute pancreatitis: This is an incidental finding notably on CT scan of the abdomen. Patient is a very poor historian and unable to get any information from him. He seems to be tolerating his clear liquid diet. I will advance this and monitor the patient for nausea or abdominal pain.  2. Hypothyroidism: He is on Synthroid.  3. Dementia: Continue Aricept. 4. Pneumonia: Patient's CT scan of the abdomen also shows possible pneumonia. I am unable to obtain any information regarding this. Patient does have elevated white blood cell count which could be from pancreatitis or underlying pneumonia. I will treat empirically on Levaquin.  5. Lactic acidosis: This is secondary to dehydration and pancreatitis. Lactic acid level is now normalized.           Management plans discussed with the nurse.  Code  STATUS: DO NOT RESUSCITATE   TOTAL TIME TAKING CARE OF THIS PATIENT: 30 minutes .     POSSIBLE D/C tomorrow , DEPENDING ON CLINICAL CONDITION.   Kattleya Kuhnert M.D on 05/08/2015 at 10:43 AM  Between 7am to 6pm - Pager - [phone] After 6pm go to [email or web address] - password [email or web address] [facility]  [place] [facility]  Office  [phone]  CC: Primary care physician; The [address]

## 2015-05-08 NOTE — Clinical Social Work Note (Signed)
Clinical Social Work Assessment  Patient Details  Name: Corey Miller MRN: [id number] Date of Birth: 18-Oct-1927  Date of referral:  05/08/15               Reason for consult:  Facility Placement, Other (Comment Required) (From [facility])                Permission sought to share information with:  Oceanographer granted to share information::  Yes, Verbal Permission Granted  Name::      [facility] [employer]::   Assisted Living Facility   Relationship::     Contact Information:     Housing/Transportation Living arrangements for the past 2 months:  Assisted Living Facility Source of Information:  Power of Attorney Patient Interpreter Needed:  None Criminal Activity/Legal Involvement Pertinent to Current Situation/Hospitalization:  No - Comment as needed Significant Relationships:  Other(Comment) (Niece Corey Miller HPOA ) Lives with:  Facility Resident Do you feel safe going back to the place where you live?  Yes Need for family participation in patient care:  Yes (Comment)  Care giving concerns: Patient is a resident at [facility].   Social Worker assessment / plan: Visual merchandiser (CSW) received consult that patient is from a facility. Per chart patient has dementia and is confused. CSW contacted patient's niece Corey Miller [facility]) [phone]. Per niece patient has been at [facility] since 05/19/14. Niece is agreeable for patient to return to [facility]. CSW made niece aware that patient may D/C over the weekend. Niece reported that she wants patient to stay a few days in the hospital at least. CSW explained that the MD will make the decision about D/C date. Niece reported that patient will need EMS for transport. CSW also contacted [facility] [place] [facility] and spoke with med tech [place]. Per Corey Miller patient can return over the weekend and CSW can fax D/C Summary to [phone]. CSW will continue to follow and assist as needed.   Employment  status:  Retired Health and safety inspector:  Medicare PT Recommendations:  Not assessed at this time Information / Referral to community resources:  Other (Comment Required) (Assisted Living Facility )  Patient/Family's Response to care:  Patient's niece Corey Miller is agreeable for patient to return to [facility] [place] but would like for him to stay a few days at the hospital.   Patient/Family's Understanding of and Emotional Response to Diagnosis, Current Treatment, and Prognosis: Niece Corey Miller was pleasant throughout assessment and thanked CSW for calling.   Emotional Assessment Appearance:    Attitude/Demeanor/Rapport:  Unable to Assess Affect (typically observed):  Unable to Assess Orientation:  Fluctuating Orientation (Suspected and/or reported Sundowners) Alcohol / Substance use:  Not Applicable Psych involvement (Current and /or in the community):  No (Comment)  Discharge Needs  Concerns to be addressed:  Discharge Planning Concerns Readmission within the last 30 days:  No Current discharge risk:  Chronically ill, Cognitively Impaired Barriers to Discharge:  Continued Medical Work up   Haig Prophet, LCSW 05/08/2015, 4:44 PM

## 2015-05-08 NOTE — Plan of Care (Signed)
Problem: Discharge Progression Outcomes Goal: Other Discharge Outcomes/Goals Outcome: Progressing Plan of Care to Goals:  1. Pain:         Denies pain. 2. Hemodynamically Stable:         VSS.         Remains on IVF's. 3. Complications:         No signs/symptoms of complications noted. 4. Diet:         Increased from Clear Liquids to Dysphagia 3.         Tolerating Well. 5. Activity:         Bedrest.

## 2015-05-08 NOTE — Progress Notes (Signed)
Initial Nutrition Assessment   INTERVENTION:   Medical Food Supplement Therapy:  Will recommend Ensure Enlive po BID, each supplement provides 350 kcal and 20 grams of protein Meals and Snacks: Cater to patient preferences once diet order able to be advanced; per RN Dot Lanes pt likely to be advanced for dinner tonight    NUTRITION DIAGNOSIS:   Inadequate oral intake related to acute illness as evidenced by  (NPO, liquids since admission.).  GOAL:   Patient will meet greater than or equal to 90% of their needs    MONITOR:    (Energy Intake, Electrolyte and Renal Profile, Digestive System, Anthropometrics, Skin)  REASON FOR ASSESSMENT:   Malnutrition Screening Tool    ASSESSMENT:   Pt admitted with acute pancreatitis.  Past Medical History  Diagnosis Date  . Hypothyroidism   . Dementia   . Hyperlipidemia     Diet Order:  Diet full liquid Room service appropriate?: Yes; Fluid consistency:: Thin   Current Nutrition: Pt ate 100% of soup, pudding and tea with lunch today. Per RN Dot Lanes pt ate most of CL tray this am at breakfast as well, tolerating liquids well today.  Food/Nutrition-Related History: Unable to assess. Per MST pt with decreased appetite PTA.   Medications: NS at 117mL/hr, Protonix, Ferrous Sulfate  Electrolyte/Renal Profile and Glucose Profile:   Recent Labs Lab 05/07/15 1552 05/08/15 0311  NA 138 142  K 3.8 3.7  CL 100* 108  CO2 27 26  BUN 36* 25*  CREATININE 1.28* 0.76  CALCIUM 9.2 8.7*  GLUCOSE 104* 76   Protein Profile:  Recent Labs Lab 05/07/15 1552  ALBUMIN 2.7*    Lipase     Component Value Date/Time   LIPASE 32 05/07/2015 2150    Gastrointestinal Profile: Last BM: unknown  Skin:   Stage I sacral pressure ulcer  Nutrition-Focused Physical Exam Findings:  Unable to complete Nutrition-Focused physical exam at this time.    Weight Change: Unable to clarify weight trend with pt on visit. Per MST pt unsure of weight PTA.  No other CHL encounters listed.  Height:   Ht Readings from Last 1 Encounters:  05/07/15  (1.727 m)    Weight:   Wt Readings from Last 1 Encounters:  05/07/15 167 lb 3.2 oz (75.841 kg)    BMI:  Body mass index is 25.43 kg/(m^2).  Estimated Nutritional Needs:   Kcal:  1862-2203kcals, BEE: 1413kcals, TEE: (IF 1.1-1.3)(AF 1.3)   Protein:  76-91g protein (1.0-1.2g/kg)  Fluid:  1895-2216mL of fluid (25-55mL/kg)  EDUCATION NEEDS:   No education needs identified at this time   MODERATE Care Level  Leda Quail, RD, LDN Pager [phone]

## 2015-05-08 NOTE — Progress Notes (Signed)
Bed alarm sounded and NA was next door.  She went immediately in and found the pt trying to slide out of the bed.  Pt stated he wanted to get some milk.  Pt did not hit the floor.  No injury noted after inspection, VS = BP 110/55, P 106, T 97.6.   MD notified.  Dr. Betti Cruz stated continue to monitor.

## 2015-05-08 NOTE — Progress Notes (Signed)
Admission assessment and navigator info completed.  Patient was able to answer questions but i am unsure of his understanding.  At times pt seemed confused and at other times he seemed confident in his answers.  I documented according to patient answers with the knowledge that said information could change.

## 2015-05-08 NOTE — Progress Notes (Signed)
ANTIBIOTIC CONSULT NOTE - INITIAL  Pharmacy Consult for Levaquin Indication: pneumonia  No Known Allergies  Patient Measurements: Height:  (172.7 cm) Weight: 167 lb 3.2 oz (75.841 kg) IBW/kg (Calculated) : 68.4 Adjusted Body Weight:   Vital Signs: Temp: 97.9 F (36.6 C) (08/05 0629) Temp Source: Oral (08/05 0629) BP: 129/58 mmHg (08/05 0629) Pulse Rate: 74 (08/05 0629) Intake/Output from previous day:   Intake/Output from this shift:    Labs:  Recent Labs  05/07/15 1552 05/08/15 0311  WBC 18.6* 17.3*  HGB 11.9* 12.0*  PLT 371 343  CREATININE 1.28* 0.76   Estimated Creatinine Clearance: 65.3 mL/min (by C-G formula based on Cr of 0.76). No results for input(s): VANCOTROUGH, VANCOPEAK, VANCORANDOM, GENTTROUGH, GENTPEAK, GENTRANDOM, TOBRATROUGH, TOBRAPEAK, TOBRARND, AMIKACINPEAK, AMIKACINTROU, AMIKACIN in the last 72 hours.   Microbiology: No results found for this or any previous visit (from the past 720 hour(s)).  Medical History: Past Medical History  Diagnosis Date  . Hypothyroidism   . Dementia   . Hyperlipidemia     Medications:  Scheduled:  . heparin  5,000 Units Subcutaneous 3 times per day  . levofloxacin  750 mg Oral Daily  . pantoprazole (PROTONIX) IV  40 mg Intravenous Q24H   Assessment: Patient being treated for CAP   Goal of Therapy:  Resolution of condition  Plan:  Will continue levofloxacin 750 mg PO q24 hours.   Ngan Qualls D 05/08/2015,8:59 AM

## 2015-05-08 NOTE — Care Management (Signed)
Admitted to [facility] with the diagnosis of acute pancreatitis. A resident of [facility] since 05/03/14. Prior to [facility]ma [facility]ge was a resident at [facility]. Sister is Donnald Garre [phone]) and niece is Barron Alvine [phone]). Sees Dr. Lorrin Mais in [place] per facility information. Needs help with bathing and dressing in the facility.  Physical therapy pending. Gwenette Greet RN MSN Care Management [phone]

## 2015-05-09 LAB — CBC
HEMATOCRIT: 33.4 % — AB (ref 40.0–52.0)
Hemoglobin: 11.4 g/dL — ABNORMAL LOW (ref 13.0–18.0)
MCH: 30.5 pg (ref 26.0–34.0)
MCHC: 34.1 g/dL (ref 32.0–36.0)
MCV: 89.3 fL (ref 80.0–100.0)
Platelets: 330 10*3/uL (ref 150–440)
RBC: 3.74 MIL/uL — AB (ref 4.40–5.90)
RDW: 12.8 % (ref 11.5–14.5)
WBC: 16.2 10*3/uL — ABNORMAL HIGH (ref 3.8–10.6)

## 2015-05-09 MED ORDER — LEVOFLOXACIN 750 MG PO TABS: 750.0000 mg | ORAL_TABLET | Freq: Every day | ORAL | Status: DC

## 2015-05-09 MED ORDER — ENSURE ENLIVE PO LIQD
237.0000 mL | Freq: Two times a day (BID) | ORAL | Status: DC
Start: 2015-05-09 — End: 2015-07-06

## 2015-05-09 NOTE — Evaluation (Signed)
Physical Therapy Evaluation Patient Details Name: Ndrew Creason MRN: [id number] DOB: Mar 27, 1928 Today's Date: 05/09/2015   History of Present Illness  Pt here with pancretitis, pneumonia  Clinical Impression  Pt does not appear to want to participate much with PT, though given his garbled speech it was difficult to fully understand what he was saying much of the time.  He seemed to indicat that he does not use an AD, per today's performance this seems unlikely?  He needed assist to get to EOB and to get to standing with HHA, but lost control of his bowels and we did not attempt further PT/ambulation.  Pt did not appear safe to return to ALF per today's eval, will benefit from STR.     Follow Up Recommendations SNF    Equipment Recommendations   (difficult to assess if he currently uses anything or not?)    Recommendations for Other Services       Precautions / Restrictions Precautions Precautions: Fall Restrictions Weight Bearing Restrictions: No      Mobility  Bed Mobility Overal bed mobility: Needs Assistance Bed Mobility: Supine to Sit;Sit to Supine     Supine to sit: Min assist Sit to supine: Min assist   General bed mobility comments: Pt not eager to get up, needs some convincing.    Transfers Overall transfer level: Needs assistance Equipment used: 1 person hand held assist (pt made it sound like he could walk w/o AD at baseline??) Transfers: Sit to/from Stand Sit to Stand: Mod assist;Max assist         General transfer comment: Pt had a small bowel movement upon standing up, did not attempt ambulation.  Ambulation/Gait                Stairs            Wheelchair Mobility    Modified Rankin (Stroke Patients Only)       Balance                                             Pertinent Vitals/Pain Pain Assessment:  (appears to have no c/o pain)    Home Living Family/patient expects to be discharged to:: Assisted  living                      Prior Function Level of Independence:  (difficult to assess secondary to garbled speech)               Hand Dominance        Extremity/Trunk Assessment   Upper Extremity Assessment: Generalized weakness           Lower Extremity Assessment: Generalized weakness         Communication   Communication:  (difficult to understand pt's speech)  Cognition Arousal/Alertness: Awake/alert Behavior During Therapy: Restless Overall Cognitive Status: Difficult to assess                      General Comments      Exercises        Assessment/Plan    PT Assessment Patient needs continued PT services  PT Diagnosis Difficulty walking;Generalized weakness   PT Problem List Decreased strength;Decreased activity tolerance;Decreased balance;Decreased mobility;Decreased safety awareness  PT Treatment Interventions Gait training;DME instruction;Therapeutic activities;Therapeutic exercise;Functional mobility training   PT Goals (Current goals can be found  in the Care Plan section) Acute Rehab PT Goals Patient Stated Goal: none Time For Goal Achievement: 05/23/15 Potential to Achieve Goals: Fair    Frequency Min 2X/week   Barriers to discharge        Co-evaluation               End of Session Equipment Utilized During Treatment: Gait belt               Time: 1242-1303 PT Time Calculation (min) (ACUTE ONLY): 21 min   Charges:   PT Evaluation $Initial PT Evaluation Tier I: 1 Procedure     PT G Codes:       Loran Senters, PT, DPT [phone]   Malachi Pro 05/09/2015, 3:48 PM

## 2015-05-09 NOTE — Discharge Summary (Addendum)
[facility] - [place] at [facility]   PATIENT NAME: Corey Miller    MR#:  [id number]  DATE OF BIRTH:  1928-08-18  DATE OF ADMISSION:  05/07/2015 ADMITTING PHYSICIAN: Altamese Dilling, MD  DATE OF DISCHARGE: 05/09/2015  PRIMARY CARE PHYSICIAN: [facility]    ADMISSION DIAGNOSIS:  Idiopathic acute pancreatitis [K85.0]  DISCHARGE DIAGNOSIS:  Active Problems:   Acute pancreatitis   Pressure ulcer   SECONDARY DIAGNOSIS:   Past Medical History  Diagnosis Date  . Hypothyroidism   . Dementia   . Hyperlipidemia     HOSPITAL COURSE:  81 year old male with a history of dementia and hypothyroidism who is at the [facility] who presented with weakness and nausea and incidentally found to have acute pancreatitis on CT scan along with pneumonia.  1. Acute pancreatitis: This is an incidental finding notably on CT scan of the abdomen. He was transitioned from nothing by mouth status to a regular diet which he tolerated well. Unclear etiology for the acute pancreatitis. He had no evidence of biliary obstruction nor does he drink alcohol.   2. Hypothyroidism: He will continue Synthroid. 3. Dementia: Continue Aricept. 4. Pneumonia: Patient's CT scan of the abdomen also shows possible pneumonia. Patient did have elevated white blood cell count which could be from pancreatitis or underlying pneumonia. He was started empirically on Levaquin. His white blood cell count has improved.  5. Lactic acidosis: This is secondary to dehydration and pancreatitis. Lactic acid level is now normalized.    DISCHARGE CONDITIONS AND DIET:  Patient's being discharge in stable condition on dysphagia 3 diet  CONSULTS OBTAINED:    none  DRUG ALLERGIES:  No Known Allergies  DISCHARGE MEDICATIONS:   Current Discharge Medication List    START taking these medications   Details  feeding supplement, ENSURE ENLIVE, (ENSURE ENLIVE) LIQD Take 237 mLs by mouth 2 (two) times daily  between meals. Qty: 237 mL, Refills: 12    levofloxacin (LEVAQUIN) 750 MG tablet Take 1 tablet (750 mg total) by mouth daily. Qty: 6 tablet, Refills: 0      CONTINUE these medications which have NOT CHANGED   Details  atorvastatin (LIPITOR) 10 MG tablet Take 10 mg by mouth at bedtime.    cholecalciferol (VITAMIN D) 400 UNITS TABS tablet Take 400 Units by mouth daily.    donepezil (ARICEPT) 10 MG tablet Take 10 mg by mouth at bedtime.    ferrous sulfate 325 (65 FE) MG tablet Take 325 mg by mouth daily with breakfast.    fesoterodine (TOVIAZ) 8 MG TB24 tablet Take 8 mg by mouth daily.    levothyroxine (SYNTHROID, LEVOTHROID) 125 MCG tablet Take 125 mcg by mouth daily before breakfast.    LORazepam (ATIVAN) 0.5 MG tablet Take 0.25 mg by mouth daily.    Melatonin 5 MG TABS Take 1 tablet by mouth at bedtime.    ondansetron (ZOFRAN) 4 MG tablet Take 4 mg by mouth every 6 (six) hours as needed for nausea or vomiting.    ranitidine (ZANTAC) 150 MG tablet Take 150 mg by mouth 2 (two) times daily.    risperiDONE (RISPERDAL) 0.5 MG tablet Take 0.5-1 mg by mouth 2 (two) times daily. Pt takes one tablet in the morning and two tablets at bedtime.    sertraline (ZOLOFT) 50 MG tablet Take 50 mg by mouth at bedtime.              Today   CHIEF COMPLAINT:  No acute events overnight  Patient is sitting in bed eyes open   VITAL SIGNS:  Blood pressure 100/51, pulse 80, temperature 98.4 F (36.9 C), temperature source Oral, resp. rate 20, height 5\' 8"  (1.727 m), weight 75.841 kg (167 lb 3.2 oz), SpO2 96 %.   REVIEW OF SYSTEMS:  Review of Systems  Unable to perform ROS: dementia     PHYSICAL EXAMINATION:  GENERAL:  81 y.o.-year-old patient lying in the bed with no acute distress. Pleasantly demented  NECK:  Supple, no jugular venous distention. No thyroid enlargement, no tenderness.  LUNGS: Normal breath sounds bilaterally, no wheezing, rales,rhonchi  No use of accessory muscles  of respiration.  CARDIOVASCULAR: S1, S2 normal. No murmurs, rubs, or gallops.  ABDOMEN: Soft, non-tender, non-distended. Bowel sounds present. No organomegaly or mass.  EXTREMITIES: No pedal edema, cyanosis, or clubbing.  PSYCHIATRIC: The patient is alert  SKIN: No obvious rash, lesion, or ulcer.   DATA REVIEW:   CBC  Recent Labs Lab 05/09/15 0439  WBC 16.2*  HGB 11.4*  HCT 33.4*  PLT 330    Chemistries   Recent Labs Lab 05/07/15 1552 05/08/15 0311  NA 138 142  K 3.8 3.7  CL 100* 108  CO2 27 26  GLUCOSE 104* 76  BUN 36* 25*  CREATININE 1.28* 0.76  CALCIUM 9.2 8.7*  AST 47*  --   ALT 114*  --   ALKPHOS 371*  --   BILITOT 1.4*  --     Cardiac Enzymes  Recent Labs Lab 05/07/15 1552  TROPONINI <0.03    Microbiology Results  @[email or web address] @  RADIOLOGY:  Ct Head Wo Contrast  05/07/2015   CLINICAL DATA:  Weakness beginning today. History of Alzheimer's disease scratch the history of dementia.  EXAM: CT HEAD WITHOUT CONTRAST  TECHNIQUE: Contiguous axial images were obtained from the base of the skull through the vertex without intravenous contrast.  COMPARISON:  Head CT scan 08/26/2014.  Brain MRI 05/01/2014.  FINDINGS: There is some cortical atrophy. No evidence of acute intracranial abnormality including hemorrhage, infarct, mass lesion, mass effect, midline shift or abnormal extra-axial fluid collection is identified. No hydrocephalus or pneumocephalus. A very short air-fluid level is noted in the right maxillary sinus. The calvarium is intact.  IMPRESSION: No acute abnormality.  Mild atrophy.   Electronically Signed   By: Drusilla Kanner M.D.   On: 05/07/2015 17:39   Ct Abdomen Pelvis W Contrast  05/07/2015   CLINICAL DATA:  Acute onset of generalized weakness. Vomiting. Decreased oral intake. Tachycardia and hypotension. Initial encounter.  EXAM: CT ABDOMEN AND PELVIS WITH CONTRAST  TECHNIQUE: Multidetector CT imaging of the abdomen and pelvis was performed using the  standard protocol following bolus administration of intravenous contrast.  CONTRAST:  80mL OMNIPAQUE IOHEXOL 300 MG/ML  SOLN  COMPARISON:  CT of the abdomen and pelvis from 05/03/2011  FINDINGS: Bibasilar opacities may reflect atelectasis or possibly mild pneumonia. Diffuse coronary artery calcifications are seen. Calcification is noted at the aortic valve. Scattered calcified mediastinal nodes are noted, likely reflecting remote granulomatous disease. There is mild wall thickening along the distal esophagus, stable from 2012 and likely reflecting the patient's baseline. A small hiatal hernia is seen.  There is diffuse soft tissue inflammation about the entirety of the pancreas, compatible with acute pancreatitis. There is no definite evidence of devascularization or pseudocyst formation at this time. Soft tissue inflammation extends inferiorly along the mesentery, and about the adjacent duodenum.  Scattered nonspecific hypodensities are seen within the liver, measuring up to 1.3  cm in size. These are grossly stable from 2012 and likely benign. The spleen is unremarkable in appearance. The gallbladder is contracted and not well assessed, though it does contain multiple stones. There is no definite evidence for obstruction or cholecystitis. The adrenal glands are unremarkable in appearance.  Prominent left renal parapelvic cysts are seen. A few tiny left renal stones are seen, measuring up to 2 mm in size. Mild nonspecific stranding is noted bilaterally. The kidneys are otherwise unremarkable. There is no evidence of hydronephrosis. No obstructing ureteral stones are seen.  No free fluid is identified. The small bowel is unremarkable in appearance. The stomach is within normal limits. No acute vascular abnormalities are seen. Mild calcification is noted at the origin of the renal arteries bilaterally, more prominent on the right.  The appendix is not definitely seen; there is no evidence of appendicitis. The sigmoid  colon is mildly redundant. The colon is unremarkable in appearance.  The bladder is mildly distended and grossly unremarkable. The prostate is borderline normal in size, with minimal calcification. There is impression on the base of the bladder by the prostate, similar in appearance to 2012. No inguinal lymphadenopathy is seen.  No acute osseous abnormalities are identified. Scattered anterior osteophytes are seen along the lower thoracic and lumbar spine. Underlying facet disease is noted along the lumbar spine.  IMPRESSION: 1. Diffuse soft tissue inflammation about the entirety of the pancreas, compatible with acute pancreatitis. No definite evidence of devascularization or pseudocyst formation at this time. Soft tissue inflammation extends inferiorly along the mesentery, and about the adjacent duodenum. 2. Bibasilar airspace opacities may reflect atelectasis or possibly mild pneumonia. 3. Mild chronic wall thickening along the distal esophagus, stable from 2012. 4. Cholelithiasis; gallbladder contracted and not well assessed, though there is no evidence for obstruction or cholecystitis. 5. Few tiny left renal stones noted. Prominent left renal parapelvic cysts seen. 6. Diffuse coronary artery calcifications seen. Calcification noted at the aortic valve. 7. Scattered calcified mediastinal nodes likely reflect remote granulomatous disease. 8. Small hiatal hernia noted. 9. Nonspecific hypodensities within the liver, measuring up to 1.3 cm in size. These are grossly stable from 2012 and likely benign.   Electronically Signed   By: Roanna Raider M.D.   On: 05/07/2015 17:45   Dg Chest Portable 1 View  05/07/2015   CLINICAL DATA:  Weakness  EXAM: PORTABLE CHEST - 1 VIEW  COMPARISON:  08/26/2014  FINDINGS: Mild prominence of the cardiomediastinal silhouette is noted without evidence for edema. Trace left pleural fluid or thickening reidentified. Allowing for portable technique, no focal airspace opacity. No acute  osseous abnormality.  IMPRESSION: No acute cardiopulmonary process. If symptoms persist, consider PA and lateral chest radiographs obtained at full inspiration when the patient is clinically able.   Electronically Signed   By: Christiana Pellant M.D.   On: 05/07/2015 16:11      Management plans discussed with the social worker. Stable for discharge   Patient should follow up with PCP in one week  CODE STATUS:     Code Status Orders        Start     Ordered   05/07/15 2052  Do not attempt resuscitation (DNR)   Continuous    Question Answer Comment  In the event of cardiac or respiratory ARREST Do not call a "code blue"   In the event of cardiac or respiratory ARREST Do not perform Intubation, CPR, defibrillation or ACLS   In the event of cardiac or  respiratory ARREST Use medication by any route, position, wound care, and other measures to relive pain and suffering. May use oxygen, suction and manual treatment of airway obstruction as needed for comfort.   Comments Portable DNR paper in chart.      05/07/15 2051    Advance Directive Documentation        Most Recent Value   Type of Advance Directive  -- [Pt has DNR paperwork present]   Pre-existing out of facility DNR order (yellow form or pink MOST form)     "MOST" Form in Place?        TOTAL TIME TAKING CARE OF THIS PATIENT: 35 minutes.    Braylen Staller M.D on 05/10/2015 at 10:34 AM  Between 7am to 6pm - Pager - [phone] After 6pm go to [email or web address] - password [email or web address] [facility]  [place] [facility]  Office  [phone]  CC: Primary care physician; [facility] [address]

## 2015-05-09 NOTE — Clinical Social Work Placement (Signed)
   CLINICAL SOCIAL WORK PLACEMENT  NOTE  Date:  05/09/2015  Patient Details  Name: Corey Miller MRN: [id number] Date of Birth: 1927-10-05  Clinical Social Work is seeking post-discharge placement for this patient at the Skilled  Nursing Facility level of care (*CSW will initial, date and re-position this form in  chart as items are completed):  Yes   Patient/family provided with [place] Clinical Social Work Department's list of facilities offering this level of care within the geographic area requested by the patient (or if unable, by the patient's family).  Yes   Patient/family informed of their freedom to choose among providers that offer the needed level of care, that participate in Medicare, Medicaid or managed care program needed by the patient, have an available bed and are willing to accept the patient.  Yes   Patient/family informed of [place]'s ownership interest in [facility] and [facility], as well as of the fact that they are under no obligation to receive care at these facilities.  PASRR submitted to EDS on       PASRR number received on       Existing PASRR number confirmed on 05/09/15     FL2 transmitted to all facilities in geographic area requested by pt/family on 05/09/15     FL2 transmitted to all facilities within larger geographic area on       Patient informed that his/her managed care company has contracts with or will negotiate with certain facilities, including the following:        Yes   Patient/family informed of bed offers received.  Patient chooses bed at       Physician recommends and patient chooses bed at      Patient to be transferred to   on  .  Patient to be transferred to facility by       Patient family notified on   of transfer.  Name of family member notified:        PHYSICIAN       Additional Comment:    _______________________________________________ Haig Prophet, LCSW 05/09/2015, 4:39 PM

## 2015-05-09 NOTE — Plan of Care (Signed)
Problem: Discharge Progression Outcomes Goal: Other Discharge Outcomes/Goals Plan of Care to Goals:  1. Pain:         Denies pain. 2. Hemodynamically Stable:         VSS.         Remains on IVF's. 3. Complications:         No signs/symptoms of complications noted. 4. Diet:         Tolerating Well. 5. Activity:         Bedrest.

## 2015-05-09 NOTE — Progress Notes (Signed)
PT is recommending SNF. CSW updated FL2 and faxed out today. Clinical Education officer, museum (CSW) met with patient's niece Sunday Spillers at bedside and provided bed offers. Patient has 2 offers [facility] and [facility]. CSW made niece aware that patient will D/C Sunday. Niece reported that she would look into 2 facilities and follow up with CSW tomorrow with choice. CSW will continue to follow and assist as needed.   Blima Rich, [place] [phone]

## 2015-05-09 NOTE — Progress Notes (Signed)
[facility] Physicians - [place] at [facility]   PATIENT NAME: Corey Miller    MR#:  [id number]  DATE OF BIRTH:  1928-09-25  SUBJECTIVE:  As per nursing acute events overnight  REVIEW OF SYSTEMS:    Review of Systems  Unable to perform ROS: dementia    Tolerating Diet:yes     DRUG ALLERGIES:  No Known Allergies  VITALS:  Blood pressure 100/51, pulse 80, temperature 98.4 F (36.9 C), temperature source Oral, resp. rate 20, height  (1.727 m), weight 75.841 kg (167 lb 3.2 oz), SpO2 96 %.  PHYSICAL EXAMINATION:   Physical Exam  Constitutional: He is well-developed, well-nourished, and in no distress. No distress.  HENT:  Head: Normocephalic.  Eyes: No scleral icterus.  Neck: Normal range of motion. Neck supple. No JVD present. No tracheal deviation present.  Cardiovascular: Normal rate, regular rhythm and normal heart sounds.  Exam reveals no gallop and no friction rub.   No murmur heard. Pulmonary/Chest: Effort normal and breath sounds normal. No respiratory distress. He has no wheezes. He has no rales. He exhibits no tenderness.  Abdominal: Soft. Bowel sounds are normal. He exhibits no distension and no mass. There is no tenderness. There is no rebound and no guarding.  Musculoskeletal: Normal range of motion. He exhibits no edema.  Neurological: He is alert.  Skin: Skin is warm. No rash noted. No erythema.      LABORATORY PANEL:   CBC  Recent Labs Lab 05/09/15 0439  WBC 16.2*  HGB 11.4*  HCT 33.4*  PLT 330   ------------------------------------------------------------------------------------------------------------------  Chemistries   Recent Labs Lab 05/07/15 1552 05/08/15 0311  NA 138 142  K 3.8 3.7  CL 100* 108  CO2 27 26  GLUCOSE 104* 76  BUN 36* 25*  CREATININE 1.28* 0.76  CALCIUM 9.2 8.7*  AST 47*  --   ALT 114*  --   ALKPHOS 371*  --   BILITOT 1.4*  --     ------------------------------------------------------------------------------------------------------------------  Cardiac Enzymes  Recent Labs Lab 05/07/15 1552  TROPONINI <0.03   ------------------------------------------------------------------------------------------------------------------  RADIOLOGY:  Ct Head Wo Contrast  05/07/2015     IMPRESSION: No acute abnormality.  Mild atrophy.   Electronically Signed   By: Drusilla Kanner M.D.   On: 05/07/2015 17:39   Ct Abdomen Pelvis W Contrast  05/07/2015   C IMPRESSION: 1. Diffuse soft tissue inflammation about the entirety of the pancreas, compatible with acute pancreatitis. No definite evidence of devascularization or pseudocyst formation at this time. Soft tissue inflammation extends inferiorly along the mesentery, and about the adjacent duodenum. 2. Bibasilar airspace opacities may reflect atelectasis or possibly mild pneumonia. 3. Mild chronic wall thickening along the distal esophagus, stable from 2012. 4. Cholelithiasis; gallbladder contracted and not well assessed, though there is no evidence for obstruction or cholecystitis. 5. Few tiny left renal stones noted. Prominent left renal parapelvic cysts seen. 6. Diffuse coronary artery calcifications seen. Calcification noted at the aortic valve. 7. Scattered calcified mediastinal nodes likely reflect remote granulomatous disease. 8. Small hiatal hernia noted. 9. Nonspecific hypodensities within the liver, measuring up to 1.3 cm in size. These are grossly stable from 2012 and likely benign.   Electronically Signed   By: Roanna Raider M.D.   On: 05/07/2015 17:45   Dg Chest Portable 1 View  05/07/2015   C  IMPRESSION: No acute cardiopulmonary process. If symptoms persist, consider PA and lateral chest radiographs obtained at full inspiration when the patient is clinically  able.   Electronically Signed   By: Christiana Pellant M.D.   On: 05/07/2015 16:11     ASSESSMENT AND PLAN:    81 year old male with a history of dementia and hypothyroidism who [place] who presented with weakness and nausea and incidentally found to have acute pink otitis on CT scan along with pneumonia.  1. Acute pancreatitis: This is an incidental finding notably on CT scan of the abdomen. Patient is a very poor historian and unable to get any information from him. He seems to be tolerating his clear liquid diet. I will advance this and monitor the patient for nausea or abdominal pain.  2. Hypothyroidism: He is on Synthroid.  3. Dementia: Continue Aricept. 4. Pneumonia: Patient's CT scan of the abdomen also shows possible pneumonia. He was empirically started on Levaquin and is currently requiring oxygen. We are trying to titrate oxygen to room air.  5. Lactic acidosis: This is secondary to dehydration and pancreatitis. Lactic acid level is now normalized.   I will consult physical therapy for recommendations for disposition.    I left a message with niece Management plans discussed with the nurse and social worker.  Code  STATUS: DO NOT RESUSCITATE   TOTAL TIME TAKING CARE OF THIS PATIENT: .     POSSIBLE D/C tomorrow , DEPENDING ON CLINICAL CONDITION.   Patrece Tallie M.D on 05/09/2015 at 10:39 AM  Between 7am to 6pm - Pager - [phone] After 6pm go to [email or web address] - password [email or web address] [facility]  [place] [facility]  Office  [phone]  CC: Primary care physician; [facility] [address]

## 2015-05-09 NOTE — Plan of Care (Signed)
Problem: Discharge Progression Outcomes Goal: Other Discharge Outcomes/Goals Outcome: Progressing Plan of care progress to goal for:  1. Discharge Plan in Place and Appropriate:         PT Evaluated. Recommended Rehabilitation.         SW facilitating with Family choice of Facility. Discharge will be facilitated after decision made. 2. Pain:         Denies pain. 3. Hemodynamically Stable:         VSS.         Labs Improving. 4. Complications:         No signs/symptoms of complications noted. 5. Diet:         Dysphagia 3 Diet. Decreased Appetite.         Feeding Supplement Ensure Enlive provided. Tolerating Well. 6. Activity:         Bedrest.

## 2015-05-10 NOTE — Plan of Care (Signed)
Problem: Discharge Progression Outcomes Goal: Other Discharge Outcomes/Goals Outcome: Progressing Plan of care progress to goal for:  1. Discharge Plan in Place and Appropriate:         PT Evaluated. Recommended Rehabilitation.         SW facilitating with Family choice of Facility. Discharge will be facilitated after decision made. 2. Pain:         No complaints of pain.  3. Hemodynamically Stable:         VSS.         Labs Improving. 4. Complications:         No signs/symptoms of complications noted. 5. Diet:         Dysphagia 3 Diet. Decreased Appetite.         Feeding Supplement Ensure Enlive provided. Tolerating Well.         Assist with feeding as necessary. 6. Activity:         Bedrest.

## 2015-05-10 NOTE — Progress Notes (Signed)
05/10/15 MD order received in [facility] to discharge pt to SNF today, Care Management previously made discharge packet for [facility]; at 1134 telephone call to [facility] [phone] spoke to [place]; telephone report given for pt discharge; no further questions voiced at this time; pt's discharge pending arrival of EMS for nonemergency transport; at [phone] contacted for non emergency transport

## 2015-05-10 NOTE — Clinical Social Work Note (Signed)
CSW notified pt's niece, RN and facility that pt would DC today via EMS to [facility].  CSW signing off.

## 2015-05-10 NOTE — Progress Notes (Signed)
EMS present for pt discharge to [facility]; discharge packet given to EMS personnel to take to the facility; pt discharged via stretcher

## 2015-06-15 ENCOUNTER — Other Ambulatory Visit: Payer: Self-pay | Admitting: Family Medicine

## 2015-06-15 DIAGNOSIS — R1310 Dysphagia, unspecified: Secondary | ICD-10-CM

## 2015-06-25 ENCOUNTER — Ambulatory Visit
Admission: RE | Admit: 2015-06-25 | Discharge: 2015-06-25 | Disposition: A | Discharge status: Home / Self Care | Payer: Medicare (Managed Care) | Source: Ambulatory Visit | Attending: Family Medicine | Admitting: Family Medicine

## 2015-06-25 ENCOUNTER — Encounter (HOSPITAL_COMMUNITY): Payer: Self-pay | Admitting: Emergency Medicine

## 2015-06-25 DIAGNOSIS — R1310 Dysphagia, unspecified: Secondary | ICD-10-CM | POA: Insufficient documentation

## 2015-06-25 NOTE — Procedures (Signed)
[facility] [place] [facility] DIAGNOSTIC RADIOLOGY [address] [place], [place], [address] Phone: [phone]   Fax:     Modified Barium Swallow  Patient Details  Name: Corey Miller MRN: [id number] Date of Birth: Mar 02, 1928 Referring Provider:  Dorothey Baseman, MD  Encounter Date: 06/25/2015   Subjective: Patient behavior: (alertness, ability to follow instructions, etc.): baseline Dementia; oriented to name and DOB; required mod-max. Cues to follow instruction/commands. Chief complaint: dysphagia   Objective:  Radiological Procedure: A videoflouroscopic evaluation of oral-preparatory, reflex initiation, and pharyngeal phases of the swallow was performed; as well as a screening of the upper esophageal phase.  I. POSTURE: upright II. VIEW: lateral III. COMPENSATORY STRATEGIES: time for multiple swallows b/t trials; alternating food and liquid boluses; volitional cough/throat clear. IV. BOLUSES ADMINISTERED:  Thin Liquid: 1 trial via cup  Nectar-thick Liquid: 2 trials via cup; 2 trials via TSP  Honey-thick Liquid: NT  Puree: 3 (1/2 tsp) trials       Mechanical Soft: NT  RESULTS OF EVALUATION: A. ORAL PREPARATORY PHASE: (The lips, tongue, and velum are observed for strength and coordination)       **Overall Severity Rating: MODERATE. Pt exhibited decreased oral and Cognitive awareness often taking larger bolus sips when instructed to take smaller sips; lingual discoordination w/ tremorous activity and lingual pumping noted. Premature spillage of bolus trials of liquids; mild+ oral residue post trials(diffuse) noted w/ all trials.  B. SWALLOW INITIATION/REFLEX: (The reflex is normal if "triggered" by the time the bolus reached the base of the tongue)  **Overall Severity Rating: MODERATE. Pt exhibited a delayed pharyngeal swallow initiation w/ liquid trials resulting in Aspiration(Silent until deeply aspirated) w/ thin liquids; and Aspiration of Nectar liquids in  a larger bolus amounts. Liquid trials spilled to the pyriform sinuses b/f the swallow was triggered; puree consistency appeared to trigger the swallow at the valleculae.   C. PHARYNGEAL PHASE: (Pharyngeal function is normal if the bolus shows rapid, smooth, and continuous transit through the pharynx and there is no pharyngeal residue after the swallow)  **Overall Severity Rating: MODERATE-SEVERE. Pt exhibited decreased pharyngeal pressure and decreased laryngeal excursion during the swallowing which resulted moderate retention of pharyngeal residue. Pt was instructed to use/given time to use f/u, dry swallows to aid reduction of this pharyngeal residue - he was unable to fully clear it. Due to the amount of the pharyngeal residue remaining coating throughout the pharynx, Silent laryngeal penetration, then aspiration, was noted of the pharyngeal residue. Pt was instructed to cough to aid clearing and protection of the airway; this required moderate cues and model for him to perform.   D. LARYNGEAL PENETRATION: (Material entering into the laryngeal inlet/vestibule but not aspirated): intermittent laryngeal penetration noted b/t swallows from the pharyngeal residue.  E. ASPIRATION: Silent aspiration of the penetrated residue building up; Aspiration of trial of thin liquid and larger trial amount of Nectar liquid - a component of Silent aspiration present as well. F. ESOPHAGEAL PHASE: (Screening of the upper esophagus): apparent Cervical vertebral changes in the cervical column just below the UES; relaxation of the UES b/t swallows w/ bolus material seeping through into the Esophagus.  ASSESSMENT: Pt presented w/ moderate-severe pharyngeal phase dysphagia; moderate oral phase dysphagia. Pt exhibited increased pharyngeal residue post swallow d/t decreased pharyngeal pressure, decreased laryngeal excursion, and reduced epiglottic inversion. The retention of this pharyngeal residue in the pharynx resulted in  Silent laryngeal penetration then aspiration occurring b/t trials AFTER the initial swallow. Pt was given time  b/t trials to use f/u, dry swallows to reduce the pharyngeal residue present. During the swallow, d/t delayed pharyngeal swallow initiation and reduced airway closure, Aspiration occurred during the swallow x1 w/ thin liquid consistency and Nectar liquid consistency via cup. Pt was noted to have reduced response to Aspiration - Silent Aspiration. When pt did give an involuntary, responsive cough to the aspiration of material as it moved further into the upper trachea, pt's cough response was reduced and would not clear some aspiration and laryngeal penetration present; a reduced effort overall and not protective of his airway. When pt was fed the Nectar liquids via TSP and small, 1/2 TSP amounts of puree, no immediate Aspiration occurred. Pt appears at increased risk for aspiration though no food or liquid consistency appears better to reduce this risk.  Rec. continue w/ current Dys. 1 diet w/ Nectar liquids w/ strict aspiration precautions, f/u, dry swallows and intermittent cough/throat clearing to clear upper airway as strategies. This diet would continue to meet pt's quality of life at this time, and but pt would need 100% supervision and monitoring at meals to follow through w/ precautions and strategies. Pt would GREATLY benefit from skilled Speech and Physical Therapy services to strengthen the pharyngeal swallow and target mobility and pulmonary strength/health; noted pt is on [place] O2 support. Strongly rec. strict monitoring of pt's pulmonary status from this point forward. If after skilled Therapy services pt's pulmonary status declines and aspiration is suspected as a contributing factor to this, would strongly recommend discussion w/ pt and family re: swallowing and any other options including alternative means of feeding.  PLAN/RECOMMENDATIONS:  A. Diet: Dys. 1 w/ Nectar liquids BY TSP  ONLY!  B. Swallowing Precautions: strict aspiration precautions - f/u, dry swallows(multiple swallowing); intermittent cough/throat clearing and at end of meal/po's  C. Recommended consultation to: Palliative Care consult; Dietician consult  D. Therapy recommendations: skilled ST to address pharyngeal phase of swallowing in particular; PT services  E. Results and recommendations were discussed w/ pt and sent to NH, MD.    Past Medical History  Diagnosis Date  . Arthritis   . Dementia   . Vertigo   . Thyroid disease     Past Surgical History  Procedure Laterality Date  . Appendectomy      There were no vitals filed for this visit.  Visit Diagnosis: Dysphagia - Plan: DG OP Swallowing Func-Medicare/Speech Path, DG OP Swallowing Func-Medicare/Speech Path                       Problem List Patient Active Problem List   Diagnosis Date Noted  . Dementia  05/02/2014  . Cellulitis right hand 05/01/2014  . Dysarthria 05/01/2014    Jerilynn Som, MS, CCC-SLP  Watson,Katherine 06/25/2015, 2:47 PM  [facility] DIAGNOSTIC RADIOLOGY [address] [place], [place], [address] Phone: [phone]   Fax:

## 2015-06-29 ENCOUNTER — Ambulatory Visit: Payer: Medicare Other

## 2015-06-30 ENCOUNTER — Ambulatory Visit: Payer: Medicare Other

## 2015-07-06 ENCOUNTER — Inpatient Hospital Stay
Admission: EM | Admit: 2015-07-06 | Discharge: 2015-08-04 | DRG: 871 | Disposition: E | Payer: Medicare Other | Attending: Internal Medicine | Admitting: Internal Medicine

## 2015-07-06 ENCOUNTER — Emergency Department: Payer: Medicare Other

## 2015-07-06 ENCOUNTER — Other Ambulatory Visit: Payer: Self-pay

## 2015-07-06 ENCOUNTER — Inpatient Hospital Stay: Payer: Medicare Other

## 2015-07-06 DIAGNOSIS — E785 Hyperlipidemia, unspecified: Secondary | ICD-10-CM | POA: Diagnosis present

## 2015-07-06 DIAGNOSIS — J9690 Respiratory failure, unspecified, unspecified whether with hypoxia or hypercapnia: Secondary | ICD-10-CM

## 2015-07-06 DIAGNOSIS — K2901 Acute gastritis with bleeding: Secondary | ICD-10-CM | POA: Diagnosis present

## 2015-07-06 DIAGNOSIS — J9691 Respiratory failure, unspecified with hypoxia: Secondary | ICD-10-CM | POA: Diagnosis not present

## 2015-07-06 DIAGNOSIS — Z515 Encounter for palliative care: Secondary | ICD-10-CM | POA: Diagnosis not present

## 2015-07-06 DIAGNOSIS — R6521 Severe sepsis with septic shock: Secondary | ICD-10-CM | POA: Diagnosis present

## 2015-07-06 DIAGNOSIS — E870 Hyperosmolality and hypernatremia: Secondary | ICD-10-CM | POA: Diagnosis present

## 2015-07-06 DIAGNOSIS — W19XXXA Unspecified fall, initial encounter: Secondary | ICD-10-CM

## 2015-07-06 DIAGNOSIS — Z66 Do not resuscitate: Secondary | ICD-10-CM | POA: Diagnosis present

## 2015-07-06 DIAGNOSIS — W19XXXS Unspecified fall, sequela: Secondary | ICD-10-CM | POA: Diagnosis not present

## 2015-07-06 DIAGNOSIS — A419 Sepsis, unspecified organism: Secondary | ICD-10-CM | POA: Diagnosis present

## 2015-07-06 DIAGNOSIS — K449 Diaphragmatic hernia without obstruction or gangrene: Secondary | ICD-10-CM | POA: Diagnosis present

## 2015-07-06 DIAGNOSIS — I471 Supraventricular tachycardia: Secondary | ICD-10-CM | POA: Diagnosis present

## 2015-07-06 DIAGNOSIS — Z809 Family history of malignant neoplasm, unspecified: Secondary | ICD-10-CM | POA: Diagnosis not present

## 2015-07-06 DIAGNOSIS — Y92129 Unspecified place in nursing home as the place of occurrence of the external cause: Secondary | ICD-10-CM

## 2015-07-06 DIAGNOSIS — D649 Anemia, unspecified: Secondary | ICD-10-CM

## 2015-07-06 DIAGNOSIS — I4891 Unspecified atrial fibrillation: Secondary | ICD-10-CM

## 2015-07-06 DIAGNOSIS — R1310 Dysphagia, unspecified: Secondary | ICD-10-CM | POA: Diagnosis present

## 2015-07-06 DIAGNOSIS — J810 Acute pulmonary edema: Secondary | ICD-10-CM | POA: Diagnosis present

## 2015-07-06 DIAGNOSIS — J159 Unspecified bacterial pneumonia: Secondary | ICD-10-CM | POA: Diagnosis present

## 2015-07-06 DIAGNOSIS — K279 Peptic ulcer, site unspecified, unspecified as acute or chronic, without hemorrhage or perforation: Secondary | ICD-10-CM | POA: Diagnosis present

## 2015-07-06 DIAGNOSIS — J690 Pneumonitis due to inhalation of food and vomit: Secondary | ICD-10-CM

## 2015-07-06 DIAGNOSIS — K219 Gastro-esophageal reflux disease without esophagitis: Secondary | ICD-10-CM | POA: Diagnosis present

## 2015-07-06 DIAGNOSIS — I959 Hypotension, unspecified: Secondary | ICD-10-CM | POA: Diagnosis not present

## 2015-07-06 DIAGNOSIS — Z79899 Other long term (current) drug therapy: Secondary | ICD-10-CM | POA: Diagnosis not present

## 2015-07-06 DIAGNOSIS — Z9049 Acquired absence of other specified parts of digestive tract: Secondary | ICD-10-CM

## 2015-07-06 DIAGNOSIS — E039 Hypothyroidism, unspecified: Secondary | ICD-10-CM | POA: Diagnosis present

## 2015-07-06 DIAGNOSIS — R0600 Dyspnea, unspecified: Secondary | ICD-10-CM

## 2015-07-06 DIAGNOSIS — M1990 Unspecified osteoarthritis, unspecified site: Secondary | ICD-10-CM | POA: Diagnosis present

## 2015-07-06 DIAGNOSIS — K920 Hematemesis: Secondary | ICD-10-CM

## 2015-07-06 DIAGNOSIS — S50311A Abrasion of right elbow, initial encounter: Secondary | ICD-10-CM | POA: Diagnosis present

## 2015-07-06 DIAGNOSIS — J9620 Acute and chronic respiratory failure, unspecified whether with hypoxia or hypercapnia: Secondary | ICD-10-CM

## 2015-07-06 DIAGNOSIS — J811 Chronic pulmonary edema: Secondary | ICD-10-CM | POA: Diagnosis not present

## 2015-07-06 DIAGNOSIS — G9341 Metabolic encephalopathy: Secondary | ICD-10-CM | POA: Diagnosis present

## 2015-07-06 DIAGNOSIS — F0390 Unspecified dementia without behavioral disturbance: Secondary | ICD-10-CM | POA: Diagnosis present

## 2015-07-06 DIAGNOSIS — R195 Other fecal abnormalities: Secondary | ICD-10-CM

## 2015-07-06 DIAGNOSIS — N289 Disorder of kidney and ureter, unspecified: Secondary | ICD-10-CM

## 2015-07-06 DIAGNOSIS — S0003XA Contusion of scalp, initial encounter: Secondary | ICD-10-CM

## 2015-07-06 DIAGNOSIS — Z87891 Personal history of nicotine dependence: Secondary | ICD-10-CM

## 2015-07-06 DIAGNOSIS — J9601 Acute respiratory failure with hypoxia: Secondary | ICD-10-CM

## 2015-07-06 DIAGNOSIS — T17908A Unspecified foreign body in respiratory tract, part unspecified causing other injury, initial encounter: Secondary | ICD-10-CM

## 2015-07-06 DIAGNOSIS — D62 Acute posthemorrhagic anemia: Secondary | ICD-10-CM

## 2015-07-06 DIAGNOSIS — I10 Essential (primary) hypertension: Secondary | ICD-10-CM | POA: Diagnosis present

## 2015-07-06 DIAGNOSIS — D72829 Elevated white blood cell count, unspecified: Secondary | ICD-10-CM

## 2015-07-06 DIAGNOSIS — S80811A Abrasion, right lower leg, initial encounter: Secondary | ICD-10-CM | POA: Diagnosis present

## 2015-07-06 DIAGNOSIS — S0083XA Contusion of other part of head, initial encounter: Secondary | ICD-10-CM | POA: Diagnosis present

## 2015-07-06 DIAGNOSIS — K2971 Gastritis, unspecified, with bleeding: Secondary | ICD-10-CM

## 2015-07-06 LAB — BASIC METABOLIC PANEL
ANION GAP: 9 (ref 5–15)
BUN: 22 mg/dL — AB (ref 6–20)
CALCIUM: 8 mg/dL — AB (ref 8.9–10.3)
CO2: 23 mmol/L (ref 22–32)
Chloride: 109 mmol/L (ref 101–111)
Creatinine, Ser: 1.33 mg/dL — ABNORMAL HIGH (ref 0.61–1.24)
GFR calc Af Amer: 55 mL/min — ABNORMAL LOW (ref >60)
GFR, EST NON AFRICAN AMERICAN: 47 mL/min — AB (ref >60)
Glucose, Bld: 82 mg/dL (ref 65–99)
POTASSIUM: 3.8 mmol/L (ref 3.5–5.1)
SODIUM: 141 mmol/L (ref 135–145)

## 2015-07-06 LAB — URINALYSIS COMPLETE WITH MICROSCOPIC (ARMC ONLY)
BILIRUBIN URINE: NEGATIVE
Bacteria, UA: NONE SEEN
Glucose, UA: NEGATIVE mg/dL
HGB URINE DIPSTICK: NEGATIVE
LEUKOCYTES UA: NEGATIVE
Nitrite: NEGATIVE
PH: 5 (ref 5.0–8.0)
Protein, ur: NEGATIVE mg/dL
Specific Gravity, Urine: 1.019 (ref 1.005–1.030)

## 2015-07-06 LAB — COMPREHENSIVE METABOLIC PANEL
ALT: 19 U/L (ref 17–63)
ANION GAP: 13 (ref 5–15)
AST: 37 U/L (ref 15–41)
Albumin: 2.5 g/dL — ABNORMAL LOW (ref 3.5–5.0)
Alkaline Phosphatase: 92 U/L (ref 38–126)
BUN: 25 mg/dL — ABNORMAL HIGH (ref 6–20)
CHLORIDE: 104 mmol/L (ref 101–111)
CO2: 30 mmol/L (ref 22–32)
Calcium: 9.5 mg/dL (ref 8.9–10.3)
Creatinine, Ser: 1.61 mg/dL — ABNORMAL HIGH (ref 0.61–1.24)
GFR, EST AFRICAN AMERICAN: 43 mL/min — AB (ref >60)
GFR, EST NON AFRICAN AMERICAN: 37 mL/min — AB (ref >60)
Glucose, Bld: 109 mg/dL — ABNORMAL HIGH (ref 65–99)
POTASSIUM: 4.1 mmol/L (ref 3.5–5.1)
SODIUM: 147 mmol/L — AB (ref 135–145)
Total Bilirubin: 0.6 mg/dL (ref 0.3–1.2)
Total Protein: 7.5 g/dL (ref 6.5–8.1)

## 2015-07-06 LAB — CBC
HCT: 31.2 % — ABNORMAL LOW (ref 40.0–52.0)
Hemoglobin: 10.2 g/dL — ABNORMAL LOW (ref 13.0–18.0)
MCH: 28.5 pg (ref 26.0–34.0)
MCHC: 32.6 g/dL (ref 32.0–36.0)
MCV: 87.4 fL (ref 80.0–100.0)
PLATELETS: 450 10*3/uL — AB (ref 150–440)
RBC: 3.57 MIL/uL — ABNORMAL LOW (ref 4.40–5.90)
RDW: 14.8 % — AB (ref 11.5–14.5)
WBC: 15.2 10*3/uL — ABNORMAL HIGH (ref 3.8–10.6)

## 2015-07-06 LAB — BLOOD GAS, ARTERIAL
ACID-BASE DEFICIT: 1.3 mmol/L (ref 0.0–2.0)
Allens test (pass/fail): POSITIVE — AB
Bicarbonate: 23.7 mEq/L (ref 21.0–28.0)
DELIVERY SYSTEMS: POSITIVE
Expiratory PAP: 6
FIO2: 1
INSPIRATORY PAP: 12
LHR: 10 {breaths}/min
O2 Saturation: 99.7 %
PCO2 ART: 40 mmHg (ref 32.0–48.0)
PH ART: 7.38 (ref 7.350–7.450)
Patient temperature: 37
pO2, Arterial: 208 mmHg — ABNORMAL HIGH (ref 83.0–108.0)

## 2015-07-06 LAB — TSH: TSH: 0.645 u[IU]/mL (ref 0.350–4.500)

## 2015-07-06 LAB — HEMOGLOBIN AND HEMATOCRIT, BLOOD
HCT: 27.7 % — ABNORMAL LOW (ref 40.0–52.0)
HEMATOCRIT: 27.4 % — AB (ref 40.0–52.0)
HEMATOCRIT: 28.9 % — AB (ref 40.0–52.0)
HEMOGLOBIN: 8.7 g/dL — AB (ref 13.0–18.0)
HEMOGLOBIN: 9 g/dL — AB (ref 13.0–18.0)
Hemoglobin: 8.8 g/dL — ABNORMAL LOW (ref 13.0–18.0)

## 2015-07-06 LAB — AMMONIA: Ammonia: 16 umol/L (ref 9–35)

## 2015-07-06 LAB — TROPONIN I
TROPONIN I: 0.03 ng/mL (ref <0.031)
Troponin I: <0.03 ng/mL (ref <0.031)
Troponin I: <0.03 ng/mL (ref <0.031)

## 2015-07-06 LAB — HEMOGLOBIN A1C: HEMOGLOBIN A1C: 4.7 % (ref 4.0–6.0)

## 2015-07-06 LAB — MRSA PCR SCREENING: MRSA BY PCR: NEGATIVE

## 2015-07-06 LAB — MAGNESIUM: MAGNESIUM: 2.2 mg/dL (ref 1.7–2.4)

## 2015-07-06 MED ORDER — SODIUM CHLORIDE 0.9 % IV SOLN
80.0000 mg | Freq: Once | INTRAVENOUS | Status: AC
  Administered 2015-07-06: 80 mg via INTRAVENOUS
  Filled 2015-07-06: qty 80

## 2015-07-06 MED ORDER — MELATONIN 5 MG PO TABS: 1.0000 | ORAL_TABLET | Freq: Every day | ORAL | Status: DC

## 2015-07-06 MED ORDER — ONDANSETRON HCL 4 MG PO TABS: 4.0000 mg | ORAL_TABLET | Freq: Four times a day (QID) | ORAL | Status: DC | PRN

## 2015-07-06 MED ORDER — SODIUM CHLORIDE 0.9 % IV SOLN
INTRAVENOUS | Status: DC
  Administered 2015-07-06 (×3): via INTRAVENOUS
  Filled 2015-07-06: qty 1000

## 2015-07-06 MED ORDER — AMIODARONE HCL 200 MG PO TABS
400.0000 mg | ORAL_TABLET | Freq: Two times a day (BID) | ORAL | Status: DC
  Filled 2015-07-06 (×3): qty 2

## 2015-07-06 MED ORDER — LEVOTHYROXINE SODIUM 75 MCG PO TABS: 150.0000 ug | ORAL_TABLET | Freq: Every day | ORAL | Status: DC

## 2015-07-06 MED ORDER — SODIUM CHLORIDE 0.9 % IV BOLUS (SEPSIS)
1000.0000 mL | Freq: Once | INTRAVENOUS | Status: AC
  Administered 2015-07-06: 1000 mL via INTRAVENOUS

## 2015-07-06 MED ORDER — PIPERACILLIN-TAZOBACTAM 3.375 G IVPB
3.3750 g | Freq: Three times a day (TID) | INTRAVENOUS | Status: DC
  Administered 2015-07-07 (×2): 3.375 g via INTRAVENOUS
  Filled 2015-07-06 (×4): qty 50

## 2015-07-06 MED ORDER — OCTREOTIDE ACETATE 500 MCG/ML IJ SOLN
25.0000 ug/h | INTRAMUSCULAR | Status: DC
  Administered 2015-07-06: 25 ug/h via INTRAVENOUS
  Filled 2015-07-06 (×2): qty 1

## 2015-07-06 MED ORDER — CETYLPYRIDINIUM CHLORIDE 0.05 % MT LIQD
7.0000 mL | Freq: Two times a day (BID) | OROMUCOSAL | Status: DC
  Administered 2015-07-06 – 2015-07-07 (×2): 7 mL via OROMUCOSAL

## 2015-07-06 MED ORDER — OCTREOTIDE LOAD VIA INFUSION
50.0000 ug | Freq: Once | INTRAVENOUS | Status: AC
  Administered 2015-07-06: 50 ug via INTRAVENOUS
  Filled 2015-07-06: qty 25

## 2015-07-06 MED ORDER — FERROUS SULFATE 325 (65 FE) MG PO TABS: 325.0000 mg | ORAL_TABLET | Freq: Every day | ORAL | Status: DC

## 2015-07-06 MED ORDER — IPRATROPIUM-ALBUTEROL 0.5-2.5 (3) MG/3ML IN SOLN: 3.0000 mL | Freq: Four times a day (QID) | RESPIRATORY_TRACT | Status: DC | PRN

## 2015-07-06 MED ORDER — AMIODARONE HCL IN DEXTROSE 360-4.14 MG/200ML-% IV SOLN
30.0000 mg/h | INTRAVENOUS | Status: DC
  Administered 2015-07-06: 30 mg/h via INTRAVENOUS
  Filled 2015-07-06 (×4): qty 200

## 2015-07-06 MED ORDER — VANCOMYCIN HCL IN DEXTROSE 750-5 MG/150ML-% IV SOLN
750.0000 mg | INTRAVENOUS | Status: DC
  Administered 2015-07-07: 750 mg via INTRAVENOUS
  Filled 2015-07-06: qty 150

## 2015-07-06 MED ORDER — VANCOMYCIN HCL IN DEXTROSE 1-5 GM/200ML-% IV SOLN
1000.0000 mg | Freq: Once | INTRAVENOUS | Status: AC
  Administered 2015-07-06: 1000 mg via INTRAVENOUS
  Filled 2015-07-06: qty 200

## 2015-07-06 MED ORDER — CHOLECALCIFEROL 10 MCG (400 UNIT) PO TABS
400.0000 [IU] | ORAL_TABLET | Freq: Every day | ORAL | Status: DC
  Filled 2015-07-06: qty 1

## 2015-07-06 MED ORDER — FESOTERODINE FUMARATE ER 4 MG PO TB24
8.0000 mg | ORAL_TABLET | Freq: Every day | ORAL | Status: DC
  Filled 2015-07-06 (×2): qty 2

## 2015-07-06 MED ORDER — AMIODARONE HCL IN DEXTROSE 360-4.14 MG/200ML-% IV SOLN
60.0000 mg/h | INTRAVENOUS | Status: AC
  Administered 2015-07-06: 60 mg/h via INTRAVENOUS
  Filled 2015-07-06: qty 200

## 2015-07-06 MED ORDER — ACETAMINOPHEN 325 MG PO TABS: 650.0000 mg | ORAL_TABLET | Freq: Four times a day (QID) | ORAL | Status: DC | PRN

## 2015-07-06 MED ORDER — AMIODARONE IV BOLUS ONLY 150 MG/100ML
150.0000 mg | Freq: Once | INTRAVENOUS | Status: AC
  Administered 2015-07-06: 150 mg via INTRAVENOUS
  Filled 2015-07-06: qty 100

## 2015-07-06 MED ORDER — NOREPINEPHRINE 4 MG/250ML-% IV SOLN
0.0000 ug/min | INTRAVENOUS | Status: DC
  Administered 2015-07-06: 4 ug/min via INTRAVENOUS
  Filled 2015-07-06: qty 250

## 2015-07-06 MED ORDER — DOCUSATE SODIUM 100 MG PO CAPS
100.0000 mg | ORAL_CAPSULE | Freq: Two times a day (BID) | ORAL | Status: DC
  Filled 2015-07-06: qty 1

## 2015-07-06 MED ORDER — DONEPEZIL HCL 5 MG PO TABS: 10.0000 mg | ORAL_TABLET | Freq: Every day | ORAL | Status: DC

## 2015-07-06 MED ORDER — SODIUM CHLORIDE 0.9 % IV SOLN
Freq: Once | INTRAVENOUS | Status: AC
  Administered 2015-07-06: 08:00:00 via INTRAVENOUS

## 2015-07-06 MED ORDER — PANTOPRAZOLE SODIUM 40 MG IV SOLR
40.0000 mg | Freq: Two times a day (BID) | INTRAVENOUS | Status: DC
  Administered 2015-07-06: 40 mg via INTRAVENOUS
  Filled 2015-07-06: qty 40

## 2015-07-06 MED ORDER — OCTREOTIDE ACETATE 50 MCG/ML IJ SOLN: 50.0000 ug | Freq: Once | INTRAMUSCULAR | Status: DC

## 2015-07-06 MED ORDER — RISPERIDONE 0.5 MG PO TABS
0.5000 mg | ORAL_TABLET | Freq: Every day | ORAL | Status: DC
  Filled 2015-07-06: qty 1

## 2015-07-06 MED ORDER — DILTIAZEM HCL 25 MG/5ML IV SOLN
5.0000 mg | Freq: Once | INTRAVENOUS | Status: AC
  Administered 2015-07-06: 5 mg via INTRAVENOUS
  Filled 2015-07-06: qty 5

## 2015-07-06 MED ORDER — PIPERACILLIN-TAZOBACTAM 3.375 G IVPB 30 MIN
3.3750 g | Freq: Once | INTRAVENOUS | Status: AC
  Administered 2015-07-06: 3.375 g via INTRAVENOUS
  Filled 2015-07-06: qty 50

## 2015-07-06 MED ORDER — PANTOPRAZOLE SODIUM 40 MG IV SOLR: 40.0000 mg | Freq: Two times a day (BID) | INTRAVENOUS | Status: DC

## 2015-07-06 MED ORDER — ONDANSETRON HCL 4 MG/2ML IJ SOLN: 4.0000 mg | Freq: Four times a day (QID) | INTRAMUSCULAR | Status: DC | PRN

## 2015-07-06 MED ORDER — RISPERIDONE 0.5 MG PO TABS: 1.0000 mg | ORAL_TABLET | Freq: Every day | ORAL | Status: DC

## 2015-07-06 MED ORDER — RISPERIDONE 0.5 MG PO TABS: 0.5000 mg | ORAL_TABLET | Freq: Two times a day (BID) | ORAL | Status: DC

## 2015-07-06 MED ORDER — DEXTROSE 5 % IV SOLN: 4.0000 ug/min | INTRAVENOUS | Status: DC

## 2015-07-06 MED ORDER — FAMOTIDINE 20 MG PO TABS
10.0000 mg | ORAL_TABLET | Freq: Every day | ORAL | Status: DC
  Filled 2015-07-06: qty 1

## 2015-07-06 MED ORDER — SODIUM CHLORIDE 0.9 % IJ SOLN
3.0000 mL | Freq: Two times a day (BID) | INTRAMUSCULAR | Status: DC
  Administered 2015-07-06: 3 mL via INTRAVENOUS

## 2015-07-06 MED ORDER — SERTRALINE HCL 50 MG PO TABS: 50.0000 mg | ORAL_TABLET | Freq: Every day | ORAL | Status: DC

## 2015-07-06 MED ORDER — ATORVASTATIN CALCIUM 10 MG PO TABS: 10.0000 mg | ORAL_TABLET | Freq: Every day | ORAL | Status: DC

## 2015-07-06 MED ORDER — SODIUM CHLORIDE 0.9 % IV SOLN
8.0000 mg/h | INTRAVENOUS | Status: DC
  Administered 2015-07-06 – 2015-07-07 (×3): 8 mg/h via INTRAVENOUS
  Filled 2015-07-06 (×4): qty 80

## 2015-07-06 MED ORDER — ACETAMINOPHEN 650 MG RE SUPP: 650.0000 mg | Freq: Four times a day (QID) | RECTAL | Status: DC | PRN

## 2015-07-06 NOTE — Progress Notes (Signed)
PT Cancellation Note  Patient Details Name: Corey Miller MRN: [id number] DOB: [birth year]   Cancelled Treatment:    Reason Eval/Treat Not Completed: Medical issues which prohibited therapy. PT reviewed patient's chart and noticed patient had resting HR of 124 and amioderone IV and CCU transfer. PT will need new orders given transfer to CCU.   Kerin Ransom, PT, DPT    07/18/2015, 11:16 AM

## 2015-07-06 NOTE — ED Notes (Signed)
Pt continues with a-fib on monitor, rate ranging from 90-120's, skin warm and dry, no distress noted

## 2015-07-06 NOTE — Consult Note (Signed)
[facility] Pulmonary Medicine Consultation      Name: Corey Miller MRN: [id number] DOB: 15-Jan-1928    ADMISSION DATE:  July 24, 2015   CHIEF COMPLAINT:  resp distress    HISTORY OF PRESENT ILLNESS  81 yo white male admitted to ICU for acute resp distess, patient placed on biPAP for acute hypoxic resp failure, CXR shows left sided opacity , fio2 at 100%, ABG pending, patient lethargic, but seem to be comfortable on biPAP  Patient initially admitted for acute falls, no evidence of fractures, found to be in afib with RVR on the floor,  He has known history of antritis, dementia, hypothyroidism, hyperlipidemia.  Patient unfortunately is not able to provide history.  -he was noted to have some abrasions in his right lower extremity as well as his right forehead.  -On arrival to emergency room, he was noted to be in A. fib with rate of 100 to 120s. He was given 5 mg of Cardizem IV was no significant improvement of his heart rate, however drop of his blood pressure. -patient then stared on amiodarone infusion  -patient with alos guaic pos stools   SIGNIFICANT EVENTS  10/3 ICU transfer   PAST MEDICAL HISTORY    :  Past Medical History  Diagnosis Date  . Arthritis   . Vertigo   . Thyroid disease   . Hypothyroidism   . Dementia   . Hyperlipidemia    Past Surgical History  Procedure Laterality Date  . Appendectomy     Prior to Admission medications   Medication Sig Start Date End Date Taking? Authorizing Provider  acetaminophen (TYLENOL) 325 MG tablet Take 650 mg by mouth every 6 (six) hours as needed.   Yes Historical Provider, MD  atorvastatin (LIPITOR) 10 MG tablet Take 10 mg by mouth at bedtime.   Yes Historical Provider, MD  cholecalciferol (VITAMIN D) 400 UNITS TABS tablet Take 400 Units by mouth daily.   Yes Historical Provider, MD  donepezil (ARICEPT) 10 MG tablet Take 10 mg by mouth at bedtime.   Yes Historical Provider, MD  ferrous sulfate 325 (65 FE) MG tablet  Take 325 mg by mouth daily with breakfast.   Yes Historical Provider, MD  fesoterodine (TOVIAZ) 8 MG TB24 tablet Take 8 mg by mouth daily.   Yes Historical Provider, MD  ipratropium-albuterol (DUONEB) 0.5-2.5 (3) MG/3ML SOLN Take 3 mLs by nebulization every 6 (six) hours as needed.   Yes Historical Provider, MD  levothyroxine (SYNTHROID, LEVOTHROID) 150 MCG tablet Take 150 mcg by mouth daily before breakfast.   Yes Historical Provider, MD  LORazepam (ATIVAN) 0.5 MG tablet Take 0.25 mg by mouth daily.   Yes Historical Provider, MD  Melatonin 5 MG TABS Take 1 tablet by mouth at bedtime.   Yes Historical Provider, MD  ondansetron (ZOFRAN) 4 MG tablet Take 4 mg by mouth every 6 (six) hours as needed for nausea or vomiting.   Yes Historical Provider, MD  ranitidine (ZANTAC) 150 MG tablet Take 150 mg by mouth 2 (two) times daily.   Yes Historical Provider, MD  risperiDONE (RISPERDAL) 0.5 MG tablet Take 0.5-1 mg by mouth 2 (two) times daily. Pt takes one tablet in the morning and two tablets at bedtime.   Yes Historical Provider, MD  sertraline (ZOLOFT) 50 MG tablet Take 50 mg by mouth at bedtime.   Yes Historical Provider, MD  sulfamethoxazole-trimethoprim (BACTRIM DS,SEPTRA DS) 800-160 MG tablet Take 1 tablet by mouth 2 (two) times daily. 06/23/15 07/10/15 Yes Historical Provider,  MD   No Known Allergies   FAMILY HISTORY   Family History  Problem Relation Age of Onset  . Cancer Sister       SOCIAL HISTORY    reports that he has quit smoking. He does not have any smokeless tobacco history on file. He reports that he does not drink alcohol or use illicit drugs.  Review of Systems  Unable to perform ROS: critical illness      VITAL SIGNS    Temp:  [96.9 F (36.1 C)-98.2 F (36.8 C)] 98.2 F (36.8 C) (10/03 1230) Pulse Rate:  [40-124] 114 (10/03 1415) Resp:  [16-35] 30 (10/03 1415) BP: (74-115)/(44-76) 88/52 mmHg (10/03 1415) SpO2:  [81 %-98 %] 93 % (10/03 1415) FiO2 (%):  [50 %-100  %] 100 % (10/03 1245) Weight:  [133 lb 3.2 oz (60.419 kg)] 133 lb 3.2 oz (60.419 kg) (10/03 0928) HEMODYNAMICS:   VENTILATOR SETTINGS: Vent Mode:  [-]  FiO2 (%):  [50 %-100 %] 100 % INTAKE / OUTPUT:  Intake/Output Summary (Last 24 hours) at 07/20/2015 1520 Last data filed at 07/18/2015 1400  Gross per 24 hour  Intake 1205.1 ml  Output      0 ml  Net 1205.1 ml       PHYSICAL EXAM   Physical Exam  Constitutional: No distress.  HENT:  Head: Normocephalic.  Mouth/Throat: Oropharynx is clear and moist.  Laceration top of head  Eyes: Conjunctivae are normal. Pupils are equal, round, and reactive to light.  Neck: Normal range of motion. Neck supple.  Cardiovascular: Normal rate and normal heart sounds.   Irregular rhythm  Pulmonary/Chest: He is in respiratory distress. He has rales.  Abdominal: Soft. Bowel sounds are normal. He exhibits no distension.  Genitourinary: Guaiac positive stool.  Musculoskeletal: Normal range of motion.       LABS   LABS:  CBC  Recent Labs Lab 07/29/2015 0546 07/29/2015 1326  WBC 15.2*  --   HGB 10.2* 8.7*  HCT 31.2* 27.7*  PLT 450*  --    Coag's No results for input(s): APTT, INR in the last 168 hours. BMET  Recent Labs Lab 07/20/2015 0546 07/26/2015 1326  NA 147* 141  K 4.1 3.8  CL 104 109  CO2 30 23  BUN 25* 22*  CREATININE 1.61* 1.33*  GLUCOSE 109* 82   Electrolytes  Recent Labs Lab 07/15/2015 0546 08/01/2015 1326  CALCIUM 9.5 8.0*  MG 2.2  --    Sepsis Markers No results for input(s): LATICACIDVEN, PROCALCITON, O2SATVEN in the last 168 hours. ABG No results for input(s): PHART, PCO2ART, PO2ART in the last 168 hours. Liver Enzymes  Recent Labs Lab 07/12/2015 0546  AST 37  ALT 19  ALKPHOS 92  BILITOT 0.6  ALBUMIN 2.5*   Cardiac Enzymes  Recent Labs Lab 07/16/2015 0546 07/19/2015 1054  TROPONINI <0.03 <0.03   Glucose No results for input(s): GLUCAP in the last 168 hours.   Recent Results (from the past 240  hour(s))  MRSA PCR Screening     Status: None   Collection Time: 07/19/2015 12:32 PM  Result Value Ref Range Status   MRSA by PCR NEGATIVE NEGATIVE Final    Comment:        The GeneXpert MRSA Assay (FDA approved for NASAL specimens only), is one component of a comprehensive MRSA colonization surveillance program. It is not intended to diagnose MRSA infection nor to guide or monitor treatment for MRSA infections.      Current facility-administered medications:  .  0.9 %  sodium chloride infusion, , Intravenous, Continuous, Katharina Caper, MD, Stopped at 07/04/2015 1150 .  acetaminophen (TYLENOL) tablet 650 mg, 650 mg, Oral, Q6H PRN **OR** acetaminophen (TYLENOL) suppository 650 mg, 650 mg, Rectal, Q6H PRN, Katharina Caper, MD .  amiodarone (NEXTERONE PREMIX) 360 MG/200ML (1.8 mg/mL) IV infusion, 60 mg/hr, Intravenous, Continuous, Katharina Caper, MD, Last Rate: 33.3 mL/hr at 08/03/2015 1245, 60 mg/hr at 07/15/2015 1245 .  amiodarone (NEXTERONE PREMIX) 360 MG/200ML (1.8 mg/mL) IV infusion, 30 mg/hr, Intravenous, Continuous, Katharina Caper, MD .  atorvastatin (LIPITOR) tablet 10 mg, 10 mg, Oral, QHS, Katharina Caper, MD .  cholecalciferol (VITAMIN D) tablet 400 Units, 400 Units, Oral, Daily, Katharina Caper, MD, 400 Units at 07/15/2015 1037 .  docusate sodium (COLACE) capsule 100 mg, 100 mg, Oral, BID, Katharina Caper, MD, 100 mg at 07/16/2015 1037 .  donepezil (ARICEPT) tablet 10 mg, 10 mg, Oral, QHS, Katharina Caper, MD .  Melene Muller ON 07/07/2015] ferrous sulfate tablet 325 mg, 325 mg, Oral, Q breakfast, Katharina Caper, MD .  fesoterodine (TOVIAZ) tablet 8 mg, 8 mg, Oral, Daily, Katharina Caper, MD, 8 mg at 07/17/2015 1150 .  ipratropium-albuterol (DUONEB) 0.5-2.5 (3) MG/3ML nebulizer solution 3 mL, 3 mL, Nebulization, Q6H PRN, Katharina Caper, MD .  Melene Muller ON 07/07/2015] levothyroxine (SYNTHROID, LEVOTHROID) tablet 150 mcg, 150 mcg, Oral, QAC breakfast, Katharina Caper, MD .  ondansetron (ZOFRAN) tablet 4 mg, 4 mg, Oral,  Q6H PRN **OR** ondansetron (ZOFRAN) injection 4 mg, 4 mg, Intravenous, Q6H PRN, Katharina Caper, MD .  pantoprazole (PROTONIX) 80 mg in sodium chloride 0.9 % 250 mL (0.32 mg/mL) infusion, 8 mg/hr, Intravenous, Continuous, Katharina Caper, MD, Last Rate: 25 mL/hr at 07/05/2015 1222, 8 mg/hr at 07/17/2015 1222 .  risperiDONE (RISPERDAL) tablet 0.5 mg, 0.5 mg, Oral, Daily, Katharina Caper, MD, 0.5 mg at 07/22/2015 1037 .  risperiDONE (RISPERDAL) tablet 1 mg, 1 mg, Oral, QHS, Katharina Caper, MD .  sertraline (ZOLOFT) tablet 50 mg, 50 mg, Oral, QHS, Rima Vaickute, MD .  sodium chloride 0.9 % injection 3 mL, 3 mL, Intravenous, Q12H, Katharina Caper, MD, 3 mL at 07/29/2015 1038  IMAGING    Ct Head Wo Contrast  07/13/2015   CLINICAL DATA:  Larey Seat this morning. Top of head laceration. History of vertigo, dementia, hyperlipidemia.  EXAM: CT HEAD WITHOUT CONTRAST  CT CERVICAL SPINE WITHOUT CONTRAST  TECHNIQUE: Multidetector CT imaging of the head and cervical spine was performed following the standard protocol without intravenous contrast. Multiplanar CT image reconstructions of the cervical spine were also generated.  COMPARISON:  None.  FINDINGS: CT HEAD FINDINGS  The ventricles and sulci are normal for age. No intraparenchymal hemorrhage, mass effect nor midline shift. Patchy supratentorial white matter hypodensities are less than expected for patient's age and though non-specific suggest sequelae of chronic small vessel ischemic disease. No acute large vascular territory infarcts.  No abnormal extra-axial fluid collections. Basal cisterns are patent. Mild to moderate calcific atherosclerosis of the carotid siphons.  Moderate RIGHT frontal scalp hematoma without subcutaneous gas or radiopaque foreign bodies. No skull fracture. The included ocular globes and orbital contents are non-suspicious. Status post bilateral ocular lens implants. Mild RIGHT maxillary mucosal thickening without paranasal sinus air-fluid levels. The mastoid  air cells are well aerated. Patient is edentulous. 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 Tiny tubular structure in the central clivus suggest nodal cord remanent.  CT CERVICAL SPINE FINDINGS  Cervical vertebral bodies and posterior elements are intact. Maintenance of cervical lordosis. Grade 1 C6-7 anterolisthesis without  spondylolysis. LEFT C2-3, RIGHT C3-4 facets are fused on degenerative basis. Multilevel severe facet arthropathy. C1-2 articulation maintained with severe atlantodental osteoarthrosis. No prevertebral soft tissue swelling. Moderate calcific atherosclerosis the RIGHT carotid bulb. Large C4-5 ventral osteophyte deforms the posterior hypopharynx. Irregular scarring of bilateral lung apices.  IMPRESSION: CT HEAD: Moderate RIGHT frontal scalp hematoma.  No skull fracture.  No acute intracranial process, negative CT head for age.  CT CERVICAL SPINE: No acute cervical spine fracture. Grade 1 C6-7 anterolisthesis on degenerative basis.   Electronically Signed   By: Awilda Metro M.D.   On: 08/04/15 06:27   Ct Cervical Spine Wo Contrast  08-04-2015   CLINICAL DATA:  Larey Seat this morning. Top of head laceration. History of vertigo, dementia, hyperlipidemia.  EXAM: CT HEAD WITHOUT CONTRAST  CT CERVICAL SPINE WITHOUT CONTRAST  TECHNIQUE: Multidetector CT imaging of the head and cervical spine was performed following the standard protocol without intravenous contrast. Multiplanar CT image reconstructions of the cervical spine were also generated.  COMPARISON:  None.  FINDINGS: CT HEAD FINDINGS  The ventricles and sulci are normal for age. No intraparenchymal hemorrhage, mass effect nor midline shift. Patchy supratentorial white matter hypodensities are less than expected for patient's age and though non-specific suggest sequelae of chronic small vessel ischemic disease. No acute large vascular territory infarcts.  No abnormal extra-axial fluid collections. Basal cisterns are patent.  Mild to moderate calcific atherosclerosis of the carotid siphons.  Moderate RIGHT frontal scalp hematoma without subcutaneous gas or radiopaque foreign bodies. No skull fracture. The included ocular globes and orbital contents are non-suspicious. Status post bilateral ocular lens implants. Mild RIGHT maxillary mucosal thickening without paranasal sinus air-fluid levels. The mastoid air cells are well aerated. Patient is edentulous. 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 Tiny tubular structure in the central clivus suggest nodal cord remanent.  CT CERVICAL SPINE FINDINGS  Cervical vertebral bodies and posterior elements are intact. Maintenance of cervical lordosis. Grade 1 C6-7 anterolisthesis without spondylolysis. LEFT C2-3, RIGHT C3-4 facets are fused on degenerative basis. Multilevel severe facet arthropathy. C1-2 articulation maintained with severe atlantodental osteoarthrosis. No prevertebral soft tissue swelling. Moderate calcific atherosclerosis the RIGHT carotid bulb. Large C4-5 ventral osteophyte deforms the posterior hypopharynx. Irregular scarring of bilateral lung apices.  IMPRESSION: CT HEAD: Moderate RIGHT frontal scalp hematoma.  No skull fracture.  No acute intracranial process, negative CT head for age.  CT CERVICAL SPINE: No acute cervical spine fracture. Grade 1 C6-7 anterolisthesis on degenerative basis.   Electronically Signed   By: Awilda Metro M.D.   On: 08/04/2015 06:27   Dg Chest Port 1 View  August 04, 2015   CLINICAL DATA:  Worsening respiratory failure.  EXAM: PORTABLE CHEST 1 VIEW  COMPARISON:  None.  FINDINGS: Diffuse left hemithorax volume loss and airspace opacification with mediastinal shift to the left. The airspace opacity is most pronounced at the left lung base with possible pleural fluid. Clear right lung with mildly prominent interstitial markings. Small linear metallic density overlying the right lateral chest, inferiorly. Mild left shoulder degenerative  changes. Thoracic spine degenerative changes.  IMPRESSION: 1. Diffuse left lung airspace opacity and volume loss with a possible left pleural effusion. Differential considerations include mucous plugging, centrally obstructing mass and a combination of pneumonia and atelectasis. 2. Possible left pleural fluid. 3. Mild chronic interstitial lung disease on the right.   Electronically Signed   By: Beckie Salts M.D.   On: 08-04-2015 13:33   Dg Knee Complete 4 Views Right  Aug 04, 2015  CLINICAL DATA:  Fall with right knee abrasions.  EXAM: RIGHT KNEE - COMPLETE 4+ VIEW  COMPARISON:  None.  FINDINGS: No fracture, dislocation or suspicious focal osseous lesion. No joint effusion. Moderate tricompartmental osteoarthritis, most prominent in the medial compartment. Moderate superior right patellar enthesophyte. Vascular calcifications are noted in the posterior soft tissues.  IMPRESSION: 1. No fracture, joint effusion or malalignment in the right knee. 2. Moderate tricompartmental right knee osteoarthritis.   Electronically Signed   By: Delbert Phenix M.D.   On: July 09, 2015 08:21      MAJOR EVENTS/TEST RESULTS: 10/3 CXR with Left sided opacity   INDWELLING DEVICES::  MICRO DATA: MRSA PCR  Urine  Blood Resp   ANTIMICROBIALS: start zosyn 10/3    ASSESSMENT/PLAN   81 yo white male admitted to ICU for acute resp failure with acute afib wth RVR likely dx with possible acute pneumonia with acute pulmonary edema from afib  PULMONARY -Respiratory Failure-high risk for intubation -continue biPAP support, ABG pending -Wean Fio2 tolerated  CARDIOVASCULAR Needs ICU monitoring No need for vasopressors at this time -amiodarone infusion for afib  RENAL -follow UO -follow chem 7  GASTROINTESTINAL NPO for now  HEMATOLOGIC Follow CBC  INFECTIOUS Probable pneumonia -will start zosyn/vancomycin  ENDOCRINE - ICU hypoglycemic\Hyperglycemia protocol   NEUROLOGIC Avoid sedatives    I have  personally obtained a history, examined the patient, evaluated laboratory and independently reviewed  imaging results, formulated the assessment and plan and placed orders.  The Patient requires high complexity decision making for assessment and support, frequent evaluation and titration of therapies, application of advanced monitoring technologies and extensive interpretation of multiple databases. Critical Care Time devoted to patient care services described in this note is 45 minutes.   Overall, patient is critically ill, prognosis is guarded. Patient at high risk for cardiac arrest and death.    Lucie Leather, M.D.  [facility]  Medical Director [facility] Medical Director [facility] Cardio-Pulmonary Department

## 2015-07-06 NOTE — Progress Notes (Signed)
ANTIBIOTIC CONSULT NOTE - INITIAL  Pharmacy Consult for vancomycin and piperacillin/tazobactam Indication: Aspiration pneumonia  No Known Allergies  Patient Measurements: Weight: 133 lb 3.2 oz (60.419 kg) Ideal Body Weight: 68.4 kg  Vital Signs: Temp: 98.2 F (36.8 C) (10/03 1230) Temp Source: Axillary (10/03 1230) BP: 93/55 mmHg (10/03 1600) Pulse Rate: 80 (10/03 1600) Intake/Output from previous day:   Intake/Output from this shift: Total I/O In: 1321.7 [I.V.:321.7; IV Piggyback:1000] Out: -   Labs:  Recent Labs  07/26/2015 0546 07/23/2015 1326  WBC 15.2*  --   HGB 10.2* 8.7*  PLT 450*  --   CREATININE 1.61* 1.33*   Estimated Creatinine Clearance: 34.7 mL/min (by C-G formula based on Cr of 1.33). No results for input(s): VANCOTROUGH, VANCOPEAK, VANCORANDOM, GENTTROUGH, GENTPEAK, GENTRANDOM, TOBRATROUGH, TOBRAPEAK, TOBRARND, AMIKACINPEAK, AMIKACINTROU, AMIKACIN in the last 72 hours.   Microbiology: Recent Results (from the past 720 hour(s))  MRSA PCR Screening     Status: None   Collection Time: 07/17/2015 12:32 PM  Result Value Ref Range Status   MRSA by PCR NEGATIVE NEGATIVE Final    Comment:        The GeneXpert MRSA Assay (FDA approved for NASAL specimens only), is one component of a comprehensive MRSA colonization surveillance program. It is not intended to diagnose MRSA infection nor to guide or monitor treatment for MRSA infections.     Medical History: Past Medical History  Diagnosis Date  . Arthritis   . Vertigo   . Thyroid disease   . Hypothyroidism   . Dementia   . Hyperlipidemia     Medications:  Anti-infectives    Start     Dose/Rate Route Frequency Ordered Stop   07/07/15 0400  vancomycin (VANCOCIN) IVPB 750 mg/150 ml premix     750 mg 150 mL/hr over 60 Minutes Intravenous Every 24 hours 08/03/2015 1622     07/07/15 0030  piperacillin-tazobactam (ZOSYN) IVPB 3.375 g     3.375 g 12.5 mL/hr over 240 Minutes Intravenous 3 times per day  07/13/2015 1557     07/07/2015 1700  vancomycin (VANCOCIN) IVPB 1000 mg/200 mL premix     1,000 mg 200 mL/hr over 60 Minutes Intravenous  Once 07/17/2015 1604     08/02/2015 1630  piperacillin-tazobactam (ZOSYN) IVPB 3.375 g     3.375 g 100 mL/hr over 30 Minutes Intravenous  Once 07/10/2015 1555       Assessment: Pharmacy consulted to dose vancomycin and piperacillin/tazobactam in this 81 year old man for aspiration pneumonia.  Goal of Therapy:  Vancomycin trough level 15-20 mcg/ml  Plan:  Started piperacillin/tazobactam 3.375g IV q8h  Vancomycin 1g IV once followed by 750 mg q24h (stacked dose 10 hours after initial dose) Trough ordered at steady state on 10/6  Kinetics: CrCl ~ 35 mL/min (based on actual body weight) Ke=0.033 Half-life: 21 hours Vd=42.3 L Calculated Cmin = 16   Davaris Youtsey M Eyal Greenhaw 07/07/2015,4:26 PM

## 2015-07-06 NOTE — Progress Notes (Signed)
Report called to CCU RN.

## 2015-07-06 NOTE — Consult Note (Signed)
[facility] Cardiology Consultation Note  Patient ID: Corey Miller, MRN: [id number], DOB/AGE: [birth year] 81 y.o. Admit date: 07/28/2015   Date of Consult: 07/25/2015 Primary Physician: Dorothey Baseman, MD Primary Cardiologist: None  Chief Complaint:  Chief Complaint  Patient presents with  . Fall    87 yom PMHx dementia, DNR, pancreatitis presents via EMS from [facility] after staff found pt on floor with right forehead contusion, right elbow and right lower extremity abrasions.    Reason for Consult: HPI    Fall   Additional comments: 77 yom PMHx dementia, DNR, pancreatitis presents via EMS from [facility] after staff found pt on floor with right forehead contusion, right elbow and right lower extremity abrasions.      Last edited by Argentina Ponder, RN on 07/24/2015  5:41 AM. (History)     fall with atrial fibrillation with rapid ventricular rate  HPI: 81 y.o. male with recent fall and issues of injury or which the patient has had some orthopedic contusions and abrasions. The patient was in rehabilitation first floor when he had an episode of atrial fibrillation with rapid ventricular rate with shortness of breath and other significant symptoms. The patient cannot express any other concerns at this stage but has had an intravenous infusion of amiodarone which is converted him to normal sinus rhythm at this time. The patient cannot take oral medications at this time. He has had a normal troponin without evidence of myocardial infarction and other symptoms suggest no evidence of congestive heart failure or true angina  Past Medical History  Diagnosis Date  . Arthritis   . Vertigo   . Thyroid disease   . Hypothyroidism   . Dementia   . Hyperlipidemia       Surgical History:  Past Surgical History  Procedure Laterality Date  . Appendectomy       Home Meds: Prior to Admission medications   Medication Sig Start Date End Date Taking? Authorizing Provider   acetaminophen (TYLENOL) 325 MG tablet Take 650 mg by mouth every 6 (six) hours as needed.   Yes Historical Provider, MD  atorvastatin (LIPITOR) 10 MG tablet Take 10 mg by mouth at bedtime.   Yes Historical Provider, MD  cholecalciferol (VITAMIN D) 400 UNITS TABS tablet Take 400 Units by mouth daily.   Yes Historical Provider, MD  donepezil (ARICEPT) 10 MG tablet Take 10 mg by mouth at bedtime.   Yes Historical Provider, MD  ferrous sulfate 325 (65 FE) MG tablet Take 325 mg by mouth daily with breakfast.   Yes Historical Provider, MD  fesoterodine (TOVIAZ) 8 MG TB24 tablet Take 8 mg by mouth daily.   Yes Historical Provider, MD  ipratropium-albuterol (DUONEB) 0.5-2.5 (3) MG/3ML SOLN Take 3 mLs by nebulization every 6 (six) hours as needed.   Yes Historical Provider, MD  levothyroxine (SYNTHROID, LEVOTHROID) 150 MCG tablet Take 150 mcg by mouth daily before breakfast.   Yes Historical Provider, MD  LORazepam (ATIVAN) 0.5 MG tablet Take 0.25 mg by mouth daily.   Yes Historical Provider, MD  Melatonin 5 MG TABS Take 1 tablet by mouth at bedtime.   Yes Historical Provider, MD  ondansetron (ZOFRAN) 4 MG tablet Take 4 mg by mouth every 6 (six) hours as needed for nausea or vomiting.   Yes Historical Provider, MD  ranitidine (ZANTAC) 150 MG tablet Take 150 mg by mouth 2 (two) times daily.   Yes Historical Provider, MD  risperiDONE (RISPERDAL) 0.5 MG tablet Take  0.5-1 mg by mouth 2 (two) times daily. Pt takes one tablet in the morning and two tablets at bedtime.   Yes Historical Provider, MD  sertraline (ZOLOFT) 50 MG tablet Take 50 mg by mouth at bedtime.   Yes Historical Provider, MD  sulfamethoxazole-trimethoprim (BACTRIM DS,SEPTRA DS) 800-160 MG tablet Take 1 tablet by mouth 2 (two) times daily. 06/23/15 07/10/15 Yes Historical Provider, MD    Inpatient Medications:  . atorvastatin  10 mg Oral QHS  . cholecalciferol  400 Units Oral Daily  . docusate sodium  100 mg Oral BID  . donepezil  10 mg Oral  QHS  . [START ON 07/07/2015] ferrous sulfate  325 mg Oral Q breakfast  . fesoterodine  8 mg Oral Daily  . [START ON 07/07/2015] levothyroxine  150 mcg Oral QAC breakfast  . risperiDONE  0.5 mg Oral Daily  . risperiDONE  1 mg Oral QHS  . sertraline  50 mg Oral QHS  . sodium chloride  3 mL Intravenous Q12H   . sodium chloride Stopped (2015/07/19 1150)  . amiodarone 60 mg/hr (2015-07-19 1245)  . amiodarone    . octreotide  (SANDOSTATIN)    IV infusion 25 mcg/hr (07/19/2015 1153)  . pantoprozole (PROTONIX) infusion 8 mg/hr (07-19-15 1222)    Allergies: No Known Allergies  Social History   Social History  . Marital Status: Single    Spouse Name: N/A  . Number of Children: N/A  . Years of Education: N/A   Occupational History  . Not on file.   Social History Main Topics  . Smoking status: Former Games developer  . Smokeless tobacco: Not on file  . Alcohol Use: No  . Drug Use: No  . Sexual Activity: Not on file   Other Topics Concern  . Not on file   Social History Narrative   ** Merged History Encounter **         Family History  Problem Relation Age of Onset  . Cancer Sister      Review of Systems Positive for cannot assess due to inability to express issues   Labs:  Recent Labs  19-Jul-2015 0546 2015-07-19 1054  TROPONINI <0.03 <0.03   Lab Results  Component Value Date   WBC 15.2* 19-Jul-2015   HGB 10.2* July 19, 2015   HCT 31.2* 2015-07-19   MCV 87.4 2015-07-19   PLT 450* 2015-07-19    Recent Labs Lab 19-Jul-2015 0546  NA 147*  K 4.1  CL 104  CO2 30  BUN 25*  CREATININE 1.61*  CALCIUM 9.5  PROT 7.5  BILITOT 0.6  ALKPHOS 92  ALT 19  AST 37  GLUCOSE 109*   No results found for: CHOL, HDL, LDLCALC, TRIG No results found for: DDIMER  Radiology/Studies:  Ct Head Wo Contrast  07/19/15   CLINICAL DATA:  Larey Seat this morning. Top of head laceration. History of vertigo, dementia, hyperlipidemia.  EXAM: CT HEAD WITHOUT CONTRAST  CT CERVICAL SPINE WITHOUT CONTRAST   TECHNIQUE: Multidetector CT imaging of the head and cervical spine was performed following the standard protocol without intravenous contrast. Multiplanar CT image reconstructions of the cervical spine were also generated.  COMPARISON:  None.  FINDINGS: CT HEAD FINDINGS  The ventricles and sulci are normal for age. No intraparenchymal hemorrhage, mass effect nor midline shift. Patchy supratentorial white matter hypodensities are less than expected for patient's age and though non-specific suggest sequelae of chronic small vessel ischemic disease. No acute large vascular territory infarcts.  No abnormal extra-axial fluid collections. Basal  cisterns are patent. Mild to moderate calcific atherosclerosis of the carotid siphons.  Moderate RIGHT frontal scalp hematoma without subcutaneous gas or radiopaque foreign bodies. No skull fracture. The included ocular globes and orbital contents are non-suspicious. Status post bilateral ocular lens implants. Mild RIGHT maxillary mucosal thickening without paranasal sinus air-fluid levels. The mastoid air cells are well aerated. Patient is edentulous. 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 Tiny tubular structure in the central clivus suggest nodal cord remanent.  CT CERVICAL SPINE FINDINGS  Cervical vertebral bodies and posterior elements are intact. Maintenance of cervical lordosis. Grade 1 C6-7 anterolisthesis without spondylolysis. LEFT C2-3, RIGHT C3-4 facets are fused on degenerative basis. Multilevel severe facet arthropathy. C1-2 articulation maintained with severe atlantodental osteoarthrosis. No prevertebral soft tissue swelling. Moderate calcific atherosclerosis the RIGHT carotid bulb. Large C4-5 ventral osteophyte deforms the posterior hypopharynx. Irregular scarring of bilateral lung apices.  IMPRESSION: CT HEAD: Moderate RIGHT frontal scalp hematoma.  No skull fracture.  No acute intracranial process, negative CT head for age.  CT CERVICAL SPINE:  No acute cervical spine fracture. Grade 1 C6-7 anterolisthesis on degenerative basis.   Electronically Signed   By: Awilda Metro M.D.   On: Jul 21, 2015 06:27   Ct Cervical Spine Wo Contrast  2015-07-21   CLINICAL DATA:  Larey Seat this morning. Top of head laceration. History of vertigo, dementia, hyperlipidemia.  EXAM: CT HEAD WITHOUT CONTRAST  CT CERVICAL SPINE WITHOUT CONTRAST  TECHNIQUE: Multidetector CT imaging of the head and cervical spine was performed following the standard protocol without intravenous contrast. Multiplanar CT image reconstructions of the cervical spine were also generated.  COMPARISON:  None.  FINDINGS: CT HEAD FINDINGS  The ventricles and sulci are normal for age. No intraparenchymal hemorrhage, mass effect nor midline shift. Patchy supratentorial white matter hypodensities are less than expected for patient's age and though non-specific suggest sequelae of chronic small vessel ischemic disease. No acute large vascular territory infarcts.  No abnormal extra-axial fluid collections. Basal cisterns are patent. Mild to moderate calcific atherosclerosis of the carotid siphons.  Moderate RIGHT frontal scalp hematoma without subcutaneous gas or radiopaque foreign bodies. No skull fracture. The included ocular globes and orbital contents are non-suspicious. Status post bilateral ocular lens implants. Mild RIGHT maxillary mucosal thickening without paranasal sinus air-fluid levels. The mastoid air cells are well aerated. Patient is edentulous. 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 Tiny tubular structure in the central clivus suggest nodal cord remanent.  CT CERVICAL SPINE FINDINGS  Cervical vertebral bodies and posterior elements are intact. Maintenance of cervical lordosis. Grade 1 C6-7 anterolisthesis without spondylolysis. LEFT C2-3, RIGHT C3-4 facets are fused on degenerative basis. Multilevel severe facet arthropathy. C1-2 articulation maintained with severe  atlantodental osteoarthrosis. No prevertebral soft tissue swelling. Moderate calcific atherosclerosis the RIGHT carotid bulb. Large C4-5 ventral osteophyte deforms the posterior hypopharynx. Irregular scarring of bilateral lung apices.  IMPRESSION: CT HEAD: Moderate RIGHT frontal scalp hematoma.  No skull fracture.  No acute intracranial process, negative CT head for age.  CT CERVICAL SPINE: No acute cervical spine fracture. Grade 1 C6-7 anterolisthesis on degenerative basis.   Electronically Signed   By: Awilda Metro M.D.   On: 07/21/2015 06:27   Dg Op Swallowing Func-medicare/speech Path  06/25/2015   EXAM: MODIFIED BARIUM SWALLOW  TECHNIQUE: Different consistencies of barium were administered orally to the patient by the Speech Pathologist. Imaging of the pharynx was performed in the lateral projection.  FLUOROSCOPY TIME:  Radiation Exposure Index (as provided by the fluoroscopic device): 2.1  mGy  COMPARISON:  None.  FINDINGS: Thin liquid- aspiration.  Nectar thick liquid- prominent aspiration with large bolus. Aspiration of markedly reduced with small bolus.  Pure- moderate retention.  IMPRESSION: Aspiration with thin liquid and nectar. Aspiration is markedly reduced with small bolus. Moderate retention with puree.  Please refer to the Speech Pathologists report for complete details and recommendations.   Electronically Signed   By: Maisie Fus  Register   On: 06/25/2015 13:38   Dg Knee Complete 4 Views Right  07/13/2015   CLINICAL DATA:  Fall with right knee abrasions.  EXAM: RIGHT KNEE - COMPLETE 4+ VIEW  COMPARISON:  None.  FINDINGS: No fracture, dislocation or suspicious focal osseous lesion. No joint effusion. Moderate tricompartmental osteoarthritis, most prominent in the medial compartment. Moderate superior right patellar enthesophyte. Vascular calcifications are noted in the posterior soft tissues.  IMPRESSION: 1. No fracture, joint effusion or malalignment in the right knee. 2. Moderate  tricompartmental right knee osteoarthritis.   Electronically Signed   By: Delbert Phenix M.D.   On: 07/13/2015 08:21    EKG: Atrial fibrillation with rapid ventricular rate and nonspecific ST and T-wave changes  Weights: Filed Weights   2015/07/13 0928  Weight: 133 lb 3.2 oz (60.419 kg)     Physical Exam: Blood pressure 88/44, pulse 96, temperature 98.2 F (36.8 C), temperature source Axillary, resp. rate 30, weight 133 lb 3.2 oz (60.419 kg), SpO2 93 %. Body mass index is 20.26 kg/(m^2). General: Well developed, well nourished, in no acute distress. Head eyes ears nose throat: Normocephalic, atraumatic, sclera non-icteric, no xanthomas, nares are without discharge. No apparent thyromegaly and/or mass  Lungs: Normal respiratory effort.  Diffuse wheezes, few rales, no rhonchi.  Heart: Irregular with normal S1 S2. no murmur gallop, no rub, PMI is normal size and placement, carotid upstroke normal without bruit, jugular venous pressure is normal Abdomen: Soft, non-tender, non-distended with normoactive bowel sounds. No hepatomegaly. No rebound/guarding. No obvious abdominal masses. Abdominal aorta is normal size without bruit Extremities: Trace edema. no cyanosis, no clubbing,   Peripheral : 2+ bilateral upper extremity pulses, 2+ bilateral femoral pulses, 2+ bilateral dorsal pedal pulse Neuro: Alert and oriented. No facial asymmetry. No focal deficit. Moves all extremities spontaneously. Musculoskeletal: Normal muscle tone without kyphosis Psych:  Responds to questions appropriately with a normal affect.    Assessment: 81 year old male with hyperlipidemia who had a fall and abrasion with atrial fibrillation with rapid ventricular rate now spontaneously converted to normal sinus rhythm with appropriate medication and no current evidence of congestive heart failure and or myocardial infarction  Plan: 1. Continue amiodarone drip for 24 more hours and then possible change to oral medication 2.  Echocardiogram for LV systolic dysfunction and a cause of atrial fibrillation 3. No anticoagulation at this time due to recent fall and concerns of bleeding risk and currently in normal sinus rhythm 4. Begin ambulation if able  Signed, Lamar Blinks M.D. [facility] July 13, 2015, 1:16 PM

## 2015-07-06 NOTE — H&P (Addendum)
[facility] [place] [facility]   PATIENT NAME: Corey Miller    MR#:  [id number]  DATE OF BIRTH:  [birth year]  DATE OF ADMISSION:  07-10-2015  PRIMARY CARE PHYSICIAN: Dorothey Baseman, MD   REQUESTING/REFERRING PHYSICIAN:   CHIEF COMPLAINT:   Chief Complaint  Patient presents with  . Fall    87 yom PMHx dementia, DNR, pancreatitis presents via EMS [facility] after staff found pt on floor with right forehead contusion, right elbow and right lower extremity abrasions.     HISTORY OF PRESENT ILLNESS: Corey Miller  is a 81 y.o. male with a known history of antritis, dementia, hypothyroidism, hyperlipidemia who presents to the hospital after fall. Patient unfortunately is not able to provide history. However, he was noted to have some abrasions in his right lower extremity as well as his right forehead. On arrival to emergency room, he was noted to be in A. fib with rate of 100 to 120s. He was given 5 mg of Cardizem IV was no significant improvement of his heart rate, however drop of his blood pressure. Now patient is receiving second liter of normal saline infusion and his systolic blood pressure is 97 and heart rate remains in the 120s. Amiodarone bolus is planned. Patient was also noted to have dried blood around his mouth and he's stool was brown but guaiac positive. He was noted to be anemic and had leukocytosis on labs. His first troponin was also minimally elevated. His kidney function was abnormal with creatinine level of 1.6 which is up from his prior study. Hospitalist services was requested for admission  PAST MEDICAL HISTORY:   Past Medical History  Diagnosis Date  . Arthritis   . Vertigo   . Thyroid disease   . Hypothyroidism   . Dementia   . Hyperlipidemia     PAST SURGICAL HISTORY:  Past Surgical History  Procedure Laterality Date  . Appendectomy      SOCIAL HISTORY:  Social History  Substance Use Topics  . Smoking status:  Former Games developer  . Smokeless tobacco: Not on file  . Alcohol Use: No    FAMILY HISTORY:  Family History  Problem Relation Age of Onset  . Cancer Sister     DRUG ALLERGIES: No Known Allergies  Review of Systems  Unable to perform ROS: dementia    MEDICATIONS AT HOME:  Prior to Admission medications   Medication Sig Start Date End Date Taking? Authorizing Provider  acetaminophen (TYLENOL) 325 MG tablet Take 650 mg by mouth every 6 (six) hours as needed.   Yes Historical Provider, MD  atorvastatin (LIPITOR) 10 MG tablet Take 10 mg by mouth at bedtime.   Yes Historical Provider, MD  cholecalciferol (VITAMIN D) 400 UNITS TABS tablet Take 400 Units by mouth daily.   Yes Historical Provider, MD  donepezil (ARICEPT) 10 MG tablet Take 10 mg by mouth at bedtime.   Yes Historical Provider, MD  ferrous sulfate 325 (65 FE) MG tablet Take 325 mg by mouth daily with breakfast.   Yes Historical Provider, MD  fesoterodine (TOVIAZ) 8 MG TB24 tablet Take 8 mg by mouth daily.   Yes Historical Provider, MD  ipratropium-albuterol (DUONEB) 0.5-2.5 (3) MG/3ML SOLN Take 3 mLs by nebulization every 6 (six) hours as needed.   Yes Historical Provider, MD  levothyroxine (SYNTHROID, LEVOTHROID) 150 MCG tablet Take 150 mcg by mouth daily before breakfast.   Yes Historical Provider, MD  LORazepam (ATIVAN) 0.5 MG tablet Take  0.25 mg by mouth daily.   Yes Historical Provider, MD  Melatonin 5 MG TABS Take 1 tablet by mouth at bedtime.   Yes Historical Provider, MD  ondansetron (ZOFRAN) 4 MG tablet Take 4 mg by mouth every 6 (six) hours as needed for nausea or vomiting.   Yes Historical Provider, MD  ranitidine (ZANTAC) 150 MG tablet Take 150 mg by mouth 2 (two) times daily.   Yes Historical Provider, MD  risperiDONE (RISPERDAL) 0.5 MG tablet Take 0.5-1 mg by mouth 2 (two) times daily. Pt takes one tablet in the morning and two tablets at bedtime.   Yes Historical Provider, MD  sertraline (ZOLOFT) 50 MG tablet Take 50 mg  by mouth at bedtime.   Yes Historical Provider, MD  sulfamethoxazole-trimethoprim (BACTRIM DS,SEPTRA DS) 800-160 MG tablet Take 1 tablet by mouth 2 (two) times daily. 06/23/15 07/10/15 Yes Historical Provider, MD      PHYSICAL EXAMINATION:   VITAL SIGNS: Blood pressure 97/69, pulse 75, temperature 96.9 F (36.1 C), temperature source Oral, resp. rate 18, SpO2 98 %.  GENERAL:  81 y.o.-year-old patient lying in the bed with no acute distress.  EYES: Pupils equal, round, reactive to light and accommodation. No scleral icterus. Extraocular muscles intact.  HEENT: Head atraumatic, normocephalic. Oropharynx and nasopharynx clear.  NECK:  Supple, no jugular venous distention. No thyroid enlargement, no tenderness.  LUNGS: Normal breath sounds bilaterally, no wheezing, rales,rhonchi or crepitation. No use of accessory muscles of respiration.  CARDIOVASCULAR: S1, S2 normal. No murmurs, rubs, or gallops.  ABDOMEN: Soft, nontender, nondistended. Bowel sounds present. No organomegaly or mass.  EXTREMITIES: No pedal edema, cyanosis, or clubbing.  NEUROLOGIC: Cranial nerves II through XII are intact. Muscle strength 5/5 in all extremities. Sensation intact. Gait not checked.  PSYCHIATRIC: The patient is alert and oriented x 3.  SKIN: No obvious rash, lesion, or ulcer.   LABORATORY PANEL:   CBC  Recent Labs Lab 07/26/15 0546  WBC 15.2*  HGB 10.2*  HCT 31.2*  PLT 450*  MCV 87.4  MCH 28.5  MCHC 32.6  RDW 14.8*   ------------------------------------------------------------------------------------------------------------------  Chemistries   Recent Labs Lab 07-26-2015 0546  NA 147*  K 4.1  CL 104  CO2 30  GLUCOSE 109*  BUN 25*  CREATININE 1.61*  CALCIUM 9.5  AST 37  ALT 19  ALKPHOS 92  BILITOT 0.6   ------------------------------------------------------------------------------------------------------------------  Cardiac Enzymes  Recent Labs Lab Jul 26, 2015 0546  TROPONINI  <0.03   ------------------------------------------------------------------------------------------------------------------  RADIOLOGY: Ct Head Wo Contrast  07-26-15   CLINICAL DATA:  Larey Seat this morning. Top of head laceration. History of vertigo, dementia, hyperlipidemia.  EXAM: CT HEAD WITHOUT CONTRAST  CT CERVICAL SPINE WITHOUT CONTRAST  TECHNIQUE: Multidetector CT imaging of the head and cervical spine was performed following the standard protocol without intravenous contrast. Multiplanar CT image reconstructions of the cervical spine were also generated.  COMPARISON:  None.  FINDINGS: CT HEAD FINDINGS  The ventricles and sulci are normal for age. No intraparenchymal hemorrhage, mass effect nor midline shift. Patchy supratentorial white matter hypodensities are less than expected for patient's age and though non-specific suggest sequelae of chronic small vessel ischemic disease. No acute large vascular territory infarcts.  No abnormal extra-axial fluid collections. Basal cisterns are patent. Mild to moderate calcific atherosclerosis of the carotid siphons.  Moderate RIGHT frontal scalp hematoma without subcutaneous gas or radiopaque foreign bodies. No skull fracture. The included ocular globes and orbital contents are non-suspicious. Status post bilateral ocular lens implants. Mild RIGHT  maxillary mucosal thickening without paranasal sinus air-fluid levels. The mastoid air cells are well aerated. Patient is edentulous. 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 Tiny tubular structure in the central clivus suggest nodal cord remanent.  CT CERVICAL SPINE FINDINGS  Cervical vertebral bodies and posterior elements are intact. Maintenance of cervical lordosis. Grade 1 C6-7 anterolisthesis without spondylolysis. LEFT C2-3, RIGHT C3-4 facets are fused on degenerative basis. Multilevel severe facet arthropathy. C1-2 articulation maintained with severe atlantodental osteoarthrosis. No prevertebral  soft tissue swelling. Moderate calcific atherosclerosis the RIGHT carotid bulb. Large C4-5 ventral osteophyte deforms the posterior hypopharynx. Irregular scarring of bilateral lung apices.  IMPRESSION: CT HEAD: Moderate RIGHT frontal scalp hematoma.  No skull fracture.  No acute intracranial process, negative CT head for age.  CT CERVICAL SPINE: No acute cervical spine fracture. Grade 1 C6-7 anterolisthesis on degenerative basis.   Electronically Signed   By: Awilda Metro M.D.   On: 07/24/2015 06:27   Ct Cervical Spine Wo Contrast  08/03/2015   CLINICAL DATA:  Larey Seat this morning. Top of head laceration. History of vertigo, dementia, hyperlipidemia.  EXAM: CT HEAD WITHOUT CONTRAST  CT CERVICAL SPINE WITHOUT CONTRAST  TECHNIQUE: Multidetector CT imaging of the head and cervical spine was performed following the standard protocol without intravenous contrast. Multiplanar CT image reconstructions of the cervical spine were also generated.  COMPARISON:  None.  FINDINGS: CT HEAD FINDINGS  The ventricles and sulci are normal for age. No intraparenchymal hemorrhage, mass effect nor midline shift. Patchy supratentorial white matter hypodensities are less than expected for patient's age and though non-specific suggest sequelae of chronic small vessel ischemic disease. No acute large vascular territory infarcts.  No abnormal extra-axial fluid collections. Basal cisterns are patent. Mild to moderate calcific atherosclerosis of the carotid siphons.  Moderate RIGHT frontal scalp hematoma without subcutaneous gas or radiopaque foreign bodies. No skull fracture. The included ocular globes and orbital contents are non-suspicious. Status post bilateral ocular lens implants. Mild RIGHT maxillary mucosal thickening without paranasal sinus air-fluid levels. The mastoid air cells are well aerated. Patient is edentulous. 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 Tiny tubular structure in the central clivus suggest  nodal cord remanent.  CT CERVICAL SPINE FINDINGS  Cervical vertebral bodies and posterior elements are intact. Maintenance of cervical lordosis. Grade 1 C6-7 anterolisthesis without spondylolysis. LEFT C2-3, RIGHT C3-4 facets are fused on degenerative basis. Multilevel severe facet arthropathy. C1-2 articulation maintained with severe atlantodental osteoarthrosis. No prevertebral soft tissue swelling. Moderate calcific atherosclerosis the RIGHT carotid bulb. Large C4-5 ventral osteophyte deforms the posterior hypopharynx. Irregular scarring of bilateral lung apices.  IMPRESSION: CT HEAD: Moderate RIGHT frontal scalp hematoma.  No skull fracture.  No acute intracranial process, negative CT head for age.  CT CERVICAL SPINE: No acute cervical spine fracture. Grade 1 C6-7 anterolisthesis on degenerative basis.   Electronically Signed   By: Awilda Metro M.D.   On: 07/17/2015 06:27    EKG: Orders placed or performed during the hospital encounter of 07/16/2015  . ED EKG  . ED EKG    IMPRESSION AND PLAN:  Principal Problem:   Fall Active Problems:   Atrial fibrillation (HCC)   Hypotension   Acute renal insufficiency   Guaiac positive stools   Leukocytosis   Anemia 1. Atrial fibrillation, rapid ventricular response. Admit patient to telemetry versus intensive care unit for amiodarone IV drip if amiodarone bolus does not work. If however it does, we are going to initiate patient on amiodarone orally. No anticoagulation due  to concerns of gastrointestinal bleed 2. Hypotension. Continue patient on IV fluids 3. Dyspnea rales, and some cough, questionable bronchitis versus CHF. Get chest x-ray.  4. Acute renal insufficiency. Continue patient on IV fluids. Follow kidney function. Get urinalysis 5. Hypernatremia. Continue IV fluids. Follow patient's sodium level later today 6. Leukocytosis of unclear etiology, getting chest x-ray as well as urinalysis to rule out infection and culture 7. Generalized  weakness likely due to metabolic encephalopathy with physical therapist involved for further recommendations 8. Gastrointestinal bleed, initiate patient on Protonix IV twice daily. Get gastroenterologist involved for further recommendations, possible EGD    All the records are reviewed and case discussed with ED provider. Management plans discussed with the patient, family and they are in agreement.  CODE STATUS:  Full code  TOTAL TIME TAKING CARE OF THIS PATIENT: 60 minutes.    Katharina Caper M.D on 07/10/2015 at 8:19 AM  Between 7am to 6pm - Pager - [phone] After 6pm go to [email or web address] - password [email or web address] [facility]  [facility] [place] [facility]  Office  [phone]  CC: Primary care physician; Dorothey Baseman, MD   Postscriptum: Patient developed hematemesis with coffee-ground emesis, blood pressure is in 90s initiated on Protonix IV drip and transferred to intensive care unit. Due to unknown history of alcohol use. Patient was initiated ALSO on octreotide IV drip, although upon further discussion with patient's granddaughter, it appeared that he never used alcohol. Gastroenterology consultation was obtained and pending. Nothing by mouth including oral medications In regards to atrial fibrillation as patient will be nothing by mouth we are going to initiate patient on amiodarone IV drip and transfer patient to intensive care unit.  Time spent approximately 40 minutes for critical care coordination

## 2015-07-06 NOTE — Consult Note (Signed)
Consultation  Referring Provider:     Dr Winona Legato Admit date: 07-19-15 Consult date    2015/07/19     Reason for Consultation:    Coughing up blood          HPI:   Corey Miller is a 81 y.o. male with history of dementia, hypothyroidism, HL, recent admission for pancreatitis last August, who was admitted after a fall yesterday and having SVT requiring amiodarone therapy.Currently residing at [facility] due to his dementia and care needs.  During admission process, patient was noted to have some dried blood on his mouth and digital exam revealed brown but heme positive stool. Patient is unable to give history at present due to his respiratory status and dementia, however nurse reports he was transferred to ICU 7 today due to episode of coffeeground emesis, hypotension, and respiratory distress. He is currently on bipap and amiodarone drip. He is also on a Pantoprazole and Octreotide drip due to concern of GI bleeding.  Last BP 88/44 and RR is 30. Sa02 93%.  In chart review, CT scan last August demonstrated pneumonia, atherosclerosis, diffuse soft tissue inflammation about the pancreas- which extended inferiorly along the mesentery and duodenum. There was a hiatal hernia, and some chronic wall thickening at the GEJ: stable since 2012. There were also some kidney stones, stable liver hypodensities (sine 2012), and cholelithiasis.  I cannot find any endoscopic evaluation reports and there is no history of liver disease- lfts were up in August, but now normal. hgb last was 10.2 (had been 11.4 in August). There is a pending hgn now. Ammonia normal, platelets somewhat elevated. He is on H2RA at the nursing home, but not on PPI. There are no NSAIDs listed. He had a modified barium swallow last month which showed some aspiration with large nectar thick boluses- improved some with smaller bolus size. There was moderate retention of puree.     Past Medical History  Diagnosis Date  . Arthritis   . Vertigo    . Thyroid disease   . Hypothyroidism   . Dementia   . Hyperlipidemia     Past Surgical History  Procedure Laterality Date  . Appendectomy      Family History  Problem Relation Age of Onset  . Cancer Sister     Social History  Substance Use Topics  . Smoking status: Former Games developer  . Smokeless tobacco: Not on file  . Alcohol Use: No    Prior to Admission medications   Medication Sig Start Date End Date Taking? Authorizing Provider  acetaminophen (TYLENOL) 325 MG tablet Take 650 mg by mouth every 6 (six) hours as needed.   Yes Historical Provider, MD  atorvastatin (LIPITOR) 10 MG tablet Take 10 mg by mouth at bedtime.   Yes Historical Provider, MD  cholecalciferol (VITAMIN D) 400 UNITS TABS tablet Take 400 Units by mouth daily.   Yes Historical Provider, MD  donepezil (ARICEPT) 10 MG tablet Take 10 mg by mouth at bedtime.   Yes Historical Provider, MD  ferrous sulfate 325 (65 FE) MG tablet Take 325 mg by mouth daily with breakfast.   Yes Historical Provider, MD  fesoterodine (TOVIAZ) 8 MG TB24 tablet Take 8 mg by mouth daily.   Yes Historical Provider, MD  ipratropium-albuterol (DUONEB) 0.5-2.5 (3) MG/3ML SOLN Take 3 mLs by nebulization every 6 (six) hours as needed.   Yes Historical Provider, MD  levothyroxine (SYNTHROID, LEVOTHROID) 150 MCG tablet Take 150 mcg by mouth daily before breakfast.  Yes Historical Provider, MD  LORazepam (ATIVAN) 0.5 MG tablet Take 0.25 mg by mouth daily.   Yes Historical Provider, MD  Melatonin 5 MG TABS Take 1 tablet by mouth at bedtime.   Yes Historical Provider, MD  ondansetron (ZOFRAN) 4 MG tablet Take 4 mg by mouth every 6 (six) hours as needed for nausea or vomiting.   Yes Historical Provider, MD  ranitidine (ZANTAC) 150 MG tablet Take 150 mg by mouth 2 (two) times daily.   Yes Historical Provider, MD  risperiDONE (RISPERDAL) 0.5 MG tablet Take 0.5-1 mg by mouth 2 (two) times daily. Pt takes one tablet in the morning and two tablets at  bedtime.   Yes Historical Provider, MD  sertraline (ZOLOFT) 50 MG tablet Take 50 mg by mouth at bedtime.   Yes Historical Provider, MD  sulfamethoxazole-trimethoprim (BACTRIM DS,SEPTRA DS) 800-160 MG tablet Take 1 tablet by mouth 2 (two) times daily. 06/23/15 07/10/15 Yes Historical Provider, MD    Current Facility-Administered Medications  Medication Dose Route Frequency Provider Last Rate Last Dose  . 0.9 %  sodium chloride infusion   Intravenous Continuous Katharina Caper, MD   Stopped at 07/10/2015 1150  . acetaminophen (TYLENOL) tablet 650 mg  650 mg Oral Q6H PRN Katharina Caper, MD       Or  . acetaminophen (TYLENOL) suppository 650 mg  650 mg Rectal Q6H PRN Katharina Caper, MD      . amiodarone (NEXTERONE PREMIX) 360 MG/200ML (1.8 mg/mL) IV infusion  60 mg/hr Intravenous Continuous Katharina Caper, MD 33.3 mL/hr at 08/02/2015 1245 60 mg/hr at 07/20/2015 1245  . amiodarone (NEXTERONE PREMIX) 360 MG/200ML (1.8 mg/mL) IV infusion  30 mg/hr Intravenous Continuous Katharina Caper, MD      . atorvastatin (LIPITOR) tablet 10 mg  10 mg Oral QHS Katharina Caper, MD      . cholecalciferol (VITAMIN D) tablet 400 Units  400 Units Oral Daily Katharina Caper, MD   400 Units at 07/12/2015 1037  . docusate sodium (COLACE) capsule 100 mg  100 mg Oral BID Katharina Caper, MD   100 mg at 07/31/2015 1037  . donepezil (ARICEPT) tablet 10 mg  10 mg Oral QHS Katharina Caper, MD      . Melene Muller ON 07/07/2015] ferrous sulfate tablet 325 mg  325 mg Oral Q breakfast Katharina Caper, MD      . fesoterodine (TOVIAZ) tablet 8 mg  8 mg Oral Daily Katharina Caper, MD   8 mg at 07/27/2015 1150  . ipratropium-albuterol (DUONEB) 0.5-2.5 (3) MG/3ML nebulizer solution 3 mL  3 mL Nebulization Q6H PRN Katharina Caper, MD      . Melene Muller ON 07/07/2015] levothyroxine (SYNTHROID, LEVOTHROID) tablet 150 mcg  150 mcg Oral QAC breakfast Katharina Caper, MD      . octreotide (SANDOSTATIN) 500 mcg in sodium chloride 0.9 % 250 mL (2 mcg/mL) infusion  25 mcg/hr Intravenous  Continuous Katharina Caper, MD 12.5 mL/hr at 07/09/2015 1153 25 mcg/hr at 07/13/2015 1153  . ondansetron (ZOFRAN) tablet 4 mg  4 mg Oral Q6H PRN Katharina Caper, MD       Or  . ondansetron (ZOFRAN) injection 4 mg  4 mg Intravenous Q6H PRN Katharina Caper, MD      . pantoprazole (PROTONIX) 80 mg in sodium chloride 0.9 % 250 mL (0.32 mg/mL) infusion  8 mg/hr Intravenous Continuous Katharina Caper, MD 25 mL/hr at 07/07/2015 1222 8 mg/hr at 07/28/2015 1222  . risperiDONE (RISPERDAL) tablet 0.5 mg  0.5 mg Oral Daily Katharina Caper, MD  0.5 mg at 07/30/2015 1037  . risperiDONE (RISPERDAL) tablet 1 mg  1 mg Oral QHS Katharina Caper, MD      . sertraline (ZOLOFT) tablet 50 mg  50 mg Oral QHS Katharina Caper, MD      . sodium chloride 0.9 % injection 3 mL  3 mL Intravenous Q12H Katharina Caper, MD   3 mL at 07/18/2015 1038    Allergies as of 07/22/2015  . (No Known Allergies)     Review of Systems:    Unable to obtain due to patient's current illness/demetia       Physical Exam:  Vital signs in last 24 hours: Temp:  [96.9 F (36.1 C)-98.2 F (36.8 C)] 98.2 F (36.8 C) (10/03 1230) Pulse Rate:  [40-124] 96 (10/03 1245) Resp:  [16-30] 30 (10/03 1245) BP: (74-115)/(44-76) 88/44 mmHg (10/03 1230) SpO2:  [81 %-98 %] 93 % (10/03 1245) FiO2 (%):  [50 %-100 %] 100 % (10/03 1245) Weight:  [60.419 kg (133 lb 3.2 oz)] 60.419 kg (133 lb 3.2 oz) (10/03 1610)   General:   Ill appearing elderly man  Head:  Normocephalic and atraumatic.Steri strips intact to right forehead. Eyes:   No icterus.   Conjunctiva pink. Ears:  Normal auditory acuity. Mouth: There is some dried blood to the lips. Unable to complete this exam due to bipap mask. Neck:  Supple; no masses felt Lungs: Respirations even, shallow with mild retractions. He is on bipap. Lungs clear to auscultation bilaterally.   No wheezes, crackles, or rhonchi.  Heart:  S1S2, RRR, no MRG. No edema. Abdomen:   Flat, soft, nondistended, nontender. Normal bowel sounds. No  appreciable masses or hepatomegaly. No rebound signs or other peritoneal signs. Msk:  MAEW x4, No clubbing or cyanosis. Strength 4/5. Symmetrical without gross deformities. Neurologic:  Alert, oriented x 2;  Cranial nerves II-XII intact.  Skin:  Warm, dry, pink without significant lesions or rashes. Psych:  Alert and cooperative. Normal affect.  LAB RESULTS:  Recent Labs  07/23/2015 0546  WBC 15.2*  HGB 10.2*  HCT 31.2*  PLT 450*   BMET  Recent Labs  07/21/2015 0546  NA 147*  K 4.1  CL 104  CO2 30  GLUCOSE 109*  BUN 25*  CREATININE 1.61*  CALCIUM 9.5   LFT  Recent Labs  07/30/2015 0546  PROT 7.5  ALBUMIN 2.5*  AST 37  ALT 19  ALKPHOS 92  BILITOT 0.6   PT/INR No results for input(s): LABPROT, INR in the last 72 hours.  STUDIES: Ct Head Wo Contrast  07/15/2015   CLINICAL DATA:  Larey Seat this morning. Top of head laceration. History of vertigo, dementia, hyperlipidemia.  EXAM: CT HEAD WITHOUT CONTRAST  CT CERVICAL SPINE WITHOUT CONTRAST  TECHNIQUE: Multidetector CT imaging of the head and cervical spine was performed following the standard protocol without intravenous contrast. Multiplanar CT image reconstructions of the cervical spine were also generated.  COMPARISON:  None.  FINDINGS: CT HEAD FINDINGS  The ventricles and sulci are normal for age. No intraparenchymal hemorrhage, mass effect nor midline shift. Patchy supratentorial white matter hypodensities are less than expected for patient's age and though non-specific suggest sequelae of chronic small vessel ischemic disease. No acute large vascular territory infarcts.  No abnormal extra-axial fluid collections. Basal cisterns are patent. Mild to moderate calcific atherosclerosis of the carotid siphons.  Moderate RIGHT frontal scalp hematoma without subcutaneous gas or radiopaque foreign bodies. No skull fracture. The included ocular globes and orbital contents are non-suspicious. Status  post bilateral ocular lens implants. Mild  RIGHT maxillary mucosal thickening without paranasal sinus air-fluid levels. The mastoid air cells are well aerated. Patient is edentulous. 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 Tiny tubular structure in the central clivus suggest nodal cord remanent.  CT CERVICAL SPINE FINDINGS  Cervical vertebral bodies and posterior elements are intact. Maintenance of cervical lordosis. Grade 1 C6-7 anterolisthesis without spondylolysis. LEFT C2-3, RIGHT C3-4 facets are fused on degenerative basis. Multilevel severe facet arthropathy. C1-2 articulation maintained with severe atlantodental osteoarthrosis. No prevertebral soft tissue swelling. Moderate calcific atherosclerosis the RIGHT carotid bulb. Large C4-5 ventral osteophyte deforms the posterior hypopharynx. Irregular scarring of bilateral lung apices.  IMPRESSION: CT HEAD: Moderate RIGHT frontal scalp hematoma.  No skull fracture.  No acute intracranial process, negative CT head for age.  CT CERVICAL SPINE: No acute cervical spine fracture. Grade 1 C6-7 anterolisthesis on degenerative basis.   Electronically Signed   By: Awilda Metro M.D.   On: 07/30/2015 06:27   Ct Cervical Spine Wo Contrast  07/17/2015   CLINICAL DATA:  Larey Seat this morning. Top of head laceration. History of vertigo, dementia, hyperlipidemia.  EXAM: CT HEAD WITHOUT CONTRAST  CT CERVICAL SPINE WITHOUT CONTRAST  TECHNIQUE: Multidetector CT imaging of the head and cervical spine was performed following the standard protocol without intravenous contrast. Multiplanar CT image reconstructions of the cervical spine were also generated.  COMPARISON:  None.  FINDINGS: CT HEAD FINDINGS  The ventricles and sulci are normal for age. No intraparenchymal hemorrhage, mass effect nor midline shift. Patchy supratentorial white matter hypodensities are less than expected for patient's age and though non-specific suggest sequelae of chronic small vessel ischemic disease. No acute large vascular  territory infarcts.  No abnormal extra-axial fluid collections. Basal cisterns are patent. Mild to moderate calcific atherosclerosis of the carotid siphons.  Moderate RIGHT frontal scalp hematoma without subcutaneous gas or radiopaque foreign bodies. No skull fracture. The included ocular globes and orbital contents are non-suspicious. Status post bilateral ocular lens implants. Mild RIGHT maxillary mucosal thickening without paranasal sinus air-fluid levels. The mastoid air cells are well aerated. Patient is edentulous. 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 Tiny tubular structure in the central clivus suggest nodal cord remanent.  CT CERVICAL SPINE FINDINGS  Cervical vertebral bodies and posterior elements are intact. Maintenance of cervical lordosis. Grade 1 C6-7 anterolisthesis without spondylolysis. LEFT C2-3, RIGHT C3-4 facets are fused on degenerative basis. Multilevel severe facet arthropathy. C1-2 articulation maintained with severe atlantodental osteoarthrosis. No prevertebral soft tissue swelling. Moderate calcific atherosclerosis the RIGHT carotid bulb. Large C4-5 ventral osteophyte deforms the posterior hypopharynx. Irregular scarring of bilateral lung apices.  IMPRESSION: CT HEAD: Moderate RIGHT frontal scalp hematoma.  No skull fracture.  No acute intracranial process, negative CT head for age.  CT CERVICAL SPINE: No acute cervical spine fracture. Grade 1 C6-7 anterolisthesis on degenerative basis.   Electronically Signed   By: Awilda Metro M.D.   On: 07/25/2015 06:27   Dg Knee Complete 4 Views Right  07/13/2015   CLINICAL DATA:  Fall with right knee abrasions.  EXAM: RIGHT KNEE - COMPLETE 4+ VIEW  COMPARISON:  None.  FINDINGS: No fracture, dislocation or suspicious focal osseous lesion. No joint effusion. Moderate tricompartmental osteoarthritis, most prominent in the medial compartment. Moderate superior right patellar enthesophyte. Vascular calcifications are noted in  the posterior soft tissues.  IMPRESSION: 1. No fracture, joint effusion or malalignment in the right knee. 2. Moderate tricompartmental right knee osteoarthritis.   Electronically Signed  By: Delbert Phenix M.D.   On: 07/21/2015 08:21       Impression / Plan:   1. Hemoptysis v. Hematemesis: Given his episode of reported coffeeground emesis earlier this am, suspect the latter. It is of note that he had duodenal and mesenteric inflammation with the inflammation around his pancreas last August. PUD is included in his Ddx. Agree with current PPI/octretotide drip, following hgb, and would transfuse prn. Will discuss EGD with Dr Marva Panda, however his clinical situation would need to improve: ie, hemodynamically stable and stable respiratory status.   Thank you very much for this consult. These services were provided by Vevelyn Pat, NP-C, in collaboration with Christena Deem, MD, with whom I have discussed this patient in full.   Vevelyn Pat, NP-C

## 2015-07-06 NOTE — Progress Notes (Signed)

## 2015-07-06 NOTE — Progress Notes (Addendum)
RN made Dr. Renae Gloss aware that there are 2 documents in patient's chart from [facility] that state patient is a DNR and this RN called and verified the documents with patient's niece Adron Geisel who stated "yes that is correct, he is a DNR, we do not want any rescucitative measures done."   RN also made MD aware that patient's MAP fluctuates between 60-68 but has recently stayed on the lower side.  Dr. Renae Gloss stated "i ordered levophed just now and you can use the peripheral IV until morning and then address with the oncoming doctor and please place an order to make him a DNR."

## 2015-07-06 NOTE — Consult Note (Signed)
Subjective: Patient seen for possible coffee ground emesis. Please see full GI consult by Mrs. London. Patient seen and examined chart reviewed. Patient admitted after a fall yesterday found to have dried blood around his mouth and a brown but heme positive stool. Patient currently has a poor respiratory status on BiPAP and amiodarone drip for atrial fibrillation.  Objective: Vital signs in last 24 hours: Temp:  [96.9 F (36.1 C)-98.4 F (36.9 C)] 98.4 F (36.9 C) (10/03 1914) Pulse Rate:  [40-124] 69 (10/03 1915) Resp:  [16-35] 26 (10/03 1915) BP: (74-115)/(35-76) 92/56 mmHg (10/03 1915) SpO2:  [81 %-100 %] 98 % (10/03 1915) FiO2 (%):  [50 %-100 %] 75 % (10/03 1704) Weight:  [60.419 kg (133 lb 3.2 oz)-61.7 kg (136 lb 0.4 oz)] 61.7 kg (136 lb 0.4 oz) (10/03 1300) Blood pressure 92/56, pulse 69, temperature 98.4 F (36.9 C), temperature source Axillary, resp. rate 26, height  (1.702 m), weight 61.7 kg (136 lb 0.4 oz), SpO2 98 %.   Intake/Output from previous day:    Intake/Output this shift:     General appearance:  Elderly male no acute distress. Becomes very short of breath when trying to communicate. Resp:  Coarse airway noise no rhonchi or wheezing. Cardio:  Atrial fibrillation rate controlled GI:  Soft nontender nondistended bowel sounds positive normoactive Extremities:     Lab Results: Results for orders placed or performed during the hospital encounter of 07/14/15 (from the past 24 hour(s))  CBC     Status: Abnormal   Collection Time: 07-14-2015  5:46 AM  Result Value Ref Range   WBC 15.2 (H) 3.8 - 10.6 K/uL   RBC 3.57 (L) 4.40 - 5.90 MIL/uL   Hemoglobin 10.2 (L) 13.0 - 18.0 g/dL   HCT 16.1 (L) 09.6 - 04.5 %   MCV 87.4 80.0 - 100.0 fL   MCH 28.5 26.0 - 34.0 pg   MCHC 32.6 32.0 - 36.0 g/dL   RDW 40.9 (H) 81.1 - 91.4 %   Platelets 450 (H) 150 - 440 K/uL  Comprehensive metabolic panel     Status: Abnormal   Collection Time: 07/14/2015  5:46 AM  Result Value Ref  Range   Sodium 147 (H) 135 - 145 mmol/L   Potassium 4.1 3.5 - 5.1 mmol/L   Chloride 104 101 - 111 mmol/L   CO2 30 22 - 32 mmol/L   Glucose, Bld 109 (H) 65 - 99 mg/dL   BUN 25 (H) 6 - 20 mg/dL   Creatinine, Ser 7.82 (H) 0.61 - 1.24 mg/dL   Calcium 9.5 8.9 - 95.6 mg/dL   Total Protein 7.5 6.5 - 8.1 g/dL   Albumin 2.5 (L) 3.5 - 5.0 g/dL   AST 37 15 - 41 U/L   ALT 19 17 - 63 U/L   Alkaline Phosphatase 92 38 - 126 U/L   Total Bilirubin 0.6 0.3 - 1.2 mg/dL   GFR calc non Af Amer 37 (L) >60 mL/min   GFR calc Af Amer 43 (L) >60 mL/min   Anion gap 13 5 - 15  Troponin I     Status: None   Collection Time: 2015-07-14  5:46 AM  Result Value Ref Range   Troponin I <0.03 <0.031 ng/mL  Magnesium     Status: None   Collection Time: 07/14/2015  5:46 AM  Result Value Ref Range   Magnesium 2.2 1.7 - 2.4 mg/dL  Hemoglobin O1H     Status: None   Collection Time: 2015-07-14 10:54 AM  Result Value Ref Range   Hgb A1c MFr Bld 4.7 4.0 - 6.0 %  TSH     Status: None   Collection Time: Jul 19, 2015 10:54 AM  Result Value Ref Range   TSH 0.645 0.350 - 4.500 uIU/mL  Troponin I     Status: None   Collection Time: 2015-07-19 10:54 AM  Result Value Ref Range   Troponin I <0.03 <0.031 ng/mL  Ammonia     Status: None   Collection Time: Jul 19, 2015 11:54 AM  Result Value Ref Range   Ammonia 16 9 - 35 umol/L  MRSA PCR Screening     Status: None   Collection Time: Jul 19, 2015 12:32 PM  Result Value Ref Range   MRSA by PCR NEGATIVE NEGATIVE  Basic metabolic panel     Status: Abnormal   Collection Time: 07-19-15  1:26 PM  Result Value Ref Range   Sodium 141 135 - 145 mmol/L   Potassium 3.8 3.5 - 5.1 mmol/L   Chloride 109 101 - 111 mmol/L   CO2 23 22 - 32 mmol/L   Glucose, Bld 82 65 - 99 mg/dL   BUN 22 (H) 6 - 20 mg/dL   Creatinine, Ser 9.62 (H) 0.61 - 1.24 mg/dL   Calcium 8.0 (L) 8.9 - 10.3 mg/dL   GFR calc non Af Amer 47 (L) >60 mL/min   GFR calc Af Amer 55 (L) >60 mL/min   Anion gap 9 5 - 15  Troponin I      Status: None   Collection Time: 07/19/2015  1:26 PM  Result Value Ref Range   Troponin I 0.03 <0.031 ng/mL  Hemoglobin and hematocrit, blood     Status: Abnormal   Collection Time: 19-Jul-2015  1:26 PM  Result Value Ref Range   Hemoglobin 8.7 (L) 13.0 - 18.0 g/dL   HCT 95.2 (L) 84.1 - 32.4 %  Blood gas, arterial     Status: Abnormal   Collection Time: July 19, 2015  4:05 PM  Result Value Ref Range   FIO2 1.00    Delivery systems BILEVEL POSITIVE AIRWAY PRESSURE    LHR 10 resp/min   Inspiratory PAP 12    Expiratory PAP 6    pH, Arterial 7.38 7.350 - 7.450   pCO2 arterial 40 32.0 - 48.0 mmHg   pO2, Arterial 208 (H) 83.0 - 108.0 mmHg   Bicarbonate 23.7 21.0 - 28.0 mEq/L   Acid-base deficit 1.3 0.0 - 2.0 mmol/L   O2 Saturation 99.7 %   Patient temperature 37.0    Collection site RIGHT RADIAL    Sample type ARTERIAL DRAW    Allens test (pass/fail) POSITIVE (A) PASS  Urinalysis complete, with microscopic ([facility] only)     Status: Abnormal   Collection Time: 07-19-2015  5:58 PM  Result Value Ref Range   Color, Urine YELLOW (A) YELLOW   APPearance CLEAR (A) CLEAR   Glucose, UA NEGATIVE NEGATIVE mg/dL   Bilirubin Urine NEGATIVE NEGATIVE   Ketones, ur TRACE (A) NEGATIVE mg/dL   Specific Gravity, Urine 1.019 1.005 - 1.030   Hgb urine dipstick NEGATIVE NEGATIVE   pH 5.0 5.0 - 8.0   Protein, ur NEGATIVE NEGATIVE mg/dL   Nitrite NEGATIVE NEGATIVE   Leukocytes, UA NEGATIVE NEGATIVE   RBC / HPF 0-5 0 - 5 RBC/hpf   WBC, UA 0-5 0 - 5 WBC/hpf   Bacteria, UA NONE SEEN NONE SEEN   Squamous Epithelial / LPF 0-5 (A) NONE SEEN   Mucous PRESENT   Hemoglobin and hematocrit,  blood     Status: Abnormal   Collection Time: 18-Jul-2015  7:32 PM  Result Value Ref Range   Hemoglobin 8.8 (L) 13.0 - 18.0 g/dL   HCT 16.1 (L) 09.6 - 04.5 %      Recent Labs  07-18-2015 0546 07-18-15 1326 Jul 18, 2015 1932  WBC 15.2*  --   --   HGB 10.2* 8.7* 8.8*  HCT 31.2* 27.7* 27.4*  PLT 450*  --   --    BMET  Recent Labs   July 18, 2015 0546 18-Jul-2015 1326  NA 147* 141  K 4.1 3.8  CL 104 109  CO2 30 23  GLUCOSE 109* 82  BUN 25* 22*  CREATININE 1.61* 1.33*  CALCIUM 9.5 8.0*   LFT  Recent Labs  2015-07-18 0546  PROT 7.5  ALBUMIN 2.5*  AST 37  ALT 19  ALKPHOS 92  BILITOT 0.6   PT/INR No results for input(s): LABPROT, INR in the last 72 hours. Hepatitis Panel No results for input(s): HEPBSAG, HCVAB, HEPAIGM, HEPBIGM in the last 72 hours. C-Diff No results for input(s): CDIFFTOX in the last 72 hours. No results for input(s): CDIFFPCR in the last 72 hours.   Studies/Results: Ct Head Wo Contrast  Jul 18, 2015   CLINICAL DATA:  Larey Seat this morning. Top of head laceration. History of vertigo, dementia, hyperlipidemia.  EXAM: CT HEAD WITHOUT CONTRAST  CT CERVICAL SPINE WITHOUT CONTRAST  TECHNIQUE: Multidetector CT imaging of the head and cervical spine was performed following the standard protocol without intravenous contrast. Multiplanar CT image reconstructions of the cervical spine were also generated.  COMPARISON:  None.  FINDINGS: CT HEAD FINDINGS  The ventricles and sulci are normal for age. No intraparenchymal hemorrhage, mass effect nor midline shift. Patchy supratentorial white matter hypodensities are less than expected for patient's age and though non-specific suggest sequelae of chronic small vessel ischemic disease. No acute large vascular territory infarcts.  No abnormal extra-axial fluid collections. Basal cisterns are patent. Mild to moderate calcific atherosclerosis of the carotid siphons.  Moderate RIGHT frontal scalp hematoma without subcutaneous gas or radiopaque foreign bodies. No skull fracture. The included ocular globes and orbital contents are non-suspicious. Status post bilateral ocular lens implants. Mild RIGHT maxillary mucosal thickening without paranasal sinus air-fluid levels. The mastoid air cells are well aerated. Patient is edentulous. Moderate to severe RIGHT temporomandibular  osteoarthrosis. Tiny tubular structure in the central clivus suggest nodal cord remanent.  CT CERVICAL SPINE FINDINGS  Cervical vertebral bodies and posterior elements are intact. Maintenance of cervical lordosis. Grade 1 C6-7 anterolisthesis without spondylolysis. LEFT C2-3, RIGHT C3-4 facets are fused on degenerative basis. Multilevel severe facet arthropathy. C1-2 articulation maintained with severe atlantodental osteoarthrosis. No prevertebral soft tissue swelling. Moderate calcific atherosclerosis the RIGHT carotid bulb. Large C4-5 ventral osteophyte deforms the posterior hypopharynx. Irregular scarring of bilateral lung apices.  IMPRESSION: CT HEAD: Moderate RIGHT frontal scalp hematoma.  No skull fracture.  No acute intracranial process, negative CT head for age.  CT CERVICAL SPINE: No acute cervical spine fracture. Grade 1 C6-7 anterolisthesis on degenerative basis.   Electronically Signed   By: Awilda Metro M.D.   On: 07/18/15 06:27   Ct Cervical Spine Wo Contrast  2015-07-18   CLINICAL DATA:  Larey Seat this morning. Top of head laceration. History of vertigo, dementia, hyperlipidemia.  EXAM: CT HEAD WITHOUT CONTRAST  CT CERVICAL SPINE WITHOUT CONTRAST  TECHNIQUE: Multidetector CT imaging of the head and cervical spine was performed following the standard protocol without intravenous contrast. Multiplanar CT image  reconstructions of the cervical spine were also generated.  COMPARISON:  None.  FINDINGS: CT HEAD FINDINGS  The ventricles and sulci are normal for age. No intraparenchymal hemorrhage, mass effect nor midline shift. Patchy supratentorial white matter hypodensities are less than expected for patient's age and though non-specific suggest sequelae of chronic small vessel ischemic disease. No acute large vascular territory infarcts.  No abnormal extra-axial fluid collections. Basal cisterns are patent. Mild to moderate calcific atherosclerosis of the carotid siphons.  Moderate RIGHT frontal scalp  hematoma without subcutaneous gas or radiopaque foreign bodies. No skull fracture. The included ocular globes and orbital contents are non-suspicious. Status post bilateral ocular lens implants. Mild RIGHT maxillary mucosal thickening without paranasal sinus air-fluid levels. The mastoid air cells are well aerated. Patient is edentulous. 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 Tiny tubular structure in the central clivus suggest nodal cord remanent.  CT CERVICAL SPINE FINDINGS  Cervical vertebral bodies and posterior elements are intact. Maintenance of cervical lordosis. Grade 1 C6-7 anterolisthesis without spondylolysis. LEFT C2-3, RIGHT C3-4 facets are fused on degenerative basis. Multilevel severe facet arthropathy. C1-2 articulation maintained with severe atlantodental osteoarthrosis. No prevertebral soft tissue swelling. Moderate calcific atherosclerosis the RIGHT carotid bulb. Large C4-5 ventral osteophyte deforms the posterior hypopharynx. Irregular scarring of bilateral lung apices.  IMPRESSION: CT HEAD: Moderate RIGHT frontal scalp hematoma.  No skull fracture.  No acute intracranial process, negative CT head for age.  CT CERVICAL SPINE: No acute cervical spine fracture. Grade 1 C6-7 anterolisthesis on degenerative basis.   Electronically Signed   By: Awilda Metro M.D.   On: 07/07/2015 06:27   Dg Chest Port 1 View  Jul 08, 2015   CLINICAL DATA:  Worsening respiratory failure.  EXAM: PORTABLE CHEST 1 VIEW  COMPARISON:  None.  FINDINGS: Diffuse left hemithorax volume loss and airspace opacification with mediastinal shift to the left. The airspace opacity is most pronounced at the left lung base with possible pleural fluid. Clear right lung with mildly prominent interstitial markings. Small linear metallic density overlying the right lateral chest, inferiorly. Mild left shoulder degenerative changes. Thoracic spine degenerative changes.  IMPRESSION: 1. Diffuse left lung airspace  opacity and volume loss with a possible left pleural effusion. Differential considerations include mucous plugging, centrally obstructing mass and a combination of pneumonia and atelectasis. 2. Possible left pleural fluid. 3. Mild chronic interstitial lung disease on the right.   Electronically Signed   By: Beckie Salts M.D.   On: Jul 08, 2015 13:33   Dg Knee Complete 4 Views Right  07/28/2015   CLINICAL DATA:  Fall with right knee abrasions.  EXAM: RIGHT KNEE - COMPLETE 4+ VIEW  COMPARISON:  None.  FINDINGS: No fracture, dislocation or suspicious focal osseous lesion. No joint effusion. Moderate tricompartmental osteoarthritis, most prominent in the medial compartment. Moderate superior right patellar enthesophyte. Vascular calcifications are noted in the posterior soft tissues.  IMPRESSION: 1. No fracture, joint effusion or malalignment in the right knee. 2. Moderate tricompartmental right knee osteoarthritis.   Electronically Signed   By: Delbert Phenix M.D.   On: 07/15/2015 08:21    Scheduled Inpatient Medications:   . antiseptic oral rinse  7 mL Mouth Rinse BID  . atorvastatin  10 mg Oral QHS  . cholecalciferol  400 Units Oral Daily  . docusate sodium  100 mg Oral BID  . donepezil  10 mg Oral QHS  . [START ON 07/07/2015] ferrous sulfate  325 mg Oral Q breakfast  . fesoterodine  8 mg Oral Daily  . [  START ON 07/07/2015] levothyroxine  150 mcg Oral QAC breakfast  . [START ON 07/07/2015] piperacillin-tazobactam (ZOSYN)  IV  3.375 g Intravenous 3 times per day  . risperiDONE  0.5 mg Oral Daily  . risperiDONE  1 mg Oral QHS  . sertraline  50 mg Oral QHS  . sodium chloride  3 mL Intravenous Q12H  . [START ON 07/07/2015] vancomycin  750 mg Intravenous Q24H    Continuous Inpatient Infusions:   . sodium chloride 100 mL/hr at 07/25/2015 1630  . amiodarone 30 mg/hr (2015/07/25 1848)  . norepinephrine 2.667 mcg/min (07-25-2015 2009)  . pantoprozole (PROTONIX) infusion 8 mg/hr (07/25/2015 1222)    PRN  Inpatient Medications:  acetaminophen **OR** acetaminophen, ipratropium-albuterol, ondansetron **OR** ondansetron (ZOFRAN) IV  Miscellaneous:   Assessment:  1. Possible coffee ground emesis, however also possible mouth injury due  to fall. Also occult positive stool, brown without evidence of melena. BUN is at baseline or better. Currently no evidence of active GI bleeding.  Plan:  1. Serial hemoglobins. 2. Continue IV PPI. 3. Patient currently high risk for sedated evaluation due to respiratory status. 4. Transfuse as needed 5. Following, will obtain a H. pylori serology.  Christena Deem MD 2015/07/25, 8:57 PM

## 2015-07-06 NOTE — Care Management Note (Signed)
Case Management Note  Patient Details  Name: FOSTER FRERICKS MRN: [id number] Date of Birth: May 01, 1928  Subjective/Objective:  From [facility]. CSW following                Action/Plan:   Expected Discharge Date:                  Expected Discharge Plan:     In-House Referral:     Discharge planning Services     Post Acute Care Choice:    Choice offered to:     DME Arranged:    DME Agency:     HH Arranged:    HH Agency:     Status of Service:     Medicare Important Message Given:    Date Medicare IM Given:    Medicare IM give by:    Date Additional Medicare IM Given:    Additional Medicare Important Message give by:     If discussed at Long Length of Stay Meetings, dates discussed:    Additional Comments:  Marily Memos, RN 07/19/15, 1:53 PM

## 2015-07-06 NOTE — Progress Notes (Signed)
While cleaning pt up he vomited 100 ml coffee ground emesis with mucus.  Will hold po medications for now, will page attending MD.

## 2015-07-06 NOTE — Progress Notes (Signed)
Dr. Winona Legato updated on pt's status, new orders received, to transfer pt to CCU

## 2015-07-06 NOTE — ED Provider Notes (Addendum)
[facility] Emergency Department Provider Note  ____________________________________________  Time seen: Approximately 532 AM  I have reviewed the triage vital signs and the nursing notes.   HISTORY  Chief Complaint Fall  Patient with history of dementia unable to participate in history  HPI Corey Miller is a 81 y.o. male who is sent from his nursing home after an unwitnessed fall. Per EMS the patient has a history of dementia and is denying pain at this time. The nursing home wanted him evaluated but the patient is unable to give a history. The patient reports that he is here for his leg that he could not say what is wrong with his leg. The patient does have a superficial skin tear versus laceration to his right scalp. Per EMS the patient had no further complaints. The patient is unable to give any further history.   Past Medical History  Diagnosis Date  . Arthritis   . Vertigo   . Thyroid disease   . Hypothyroidism   . Dementia   . Hyperlipidemia     Patient Active Problem List   Diagnosis Date Noted  . Pressure ulcer 05/08/2015  . Acute pancreatitis 05/07/2015  . Dementia  05/02/2014  . Cellulitis right hand 05/01/2014  . Dysarthria 05/01/2014    Past Surgical History  Procedure Laterality Date  . Appendectomy      Current Outpatient Rx  Name  Route  Sig  Dispense  Refill  . atorvastatin (LIPITOR) 10 MG tablet   Oral   Take 5 mg by mouth at bedtime.         Marland Kitchen atorvastatin (LIPITOR) 10 MG tablet   Oral   Take 10 mg by mouth at bedtime.         . cephALEXin (KEFLEX) 500 MG capsule   Oral   Take 1 capsule (500 mg total) by mouth 3 (three) times daily. X 10days   30 capsule   0   . cholecalciferol (VITAMIN D) 400 UNITS TABS tablet   Oral   Take 400 Units by mouth daily.         . cholecalciferol (VITAMIN D) 400 UNITS TABS   Oral   Take 400 Units by mouth daily.         Marland Kitchen donepezil (ARICEPT) 10 MG tablet   Oral  Take 10 mg by mouth at bedtime.          . donepezil (ARICEPT) 10 MG tablet   Oral   Take 10 mg by mouth at bedtime.         Marland Kitchen doxycycline (VIBRAMYCIN) 100 MG capsule   Oral   Take 1 capsule (100 mg total) by mouth 2 (two) times daily. X 10days   20 capsule   0   . feeding supplement, ENSURE ENLIVE, (ENSURE ENLIVE) LIQD   Oral   Take 237 mLs by mouth 2 (two) times daily between meals.   237 mL   12   . ferrous sulfate 325 (65 FE) MG tablet   Oral   Take 325 mg by mouth daily with breakfast.           . ferrous sulfate 325 (65 FE) MG tablet   Oral   Take 325 mg by mouth daily with breakfast.         . fesoterodine (TOVIAZ) 8 MG TB24 tablet   Oral   Take 8 mg by mouth daily.         . fesoterodine (TOVIAZ) 8  MG TB24   Oral   Take 8 mg by mouth daily.          Marland Kitchen lactulose (CHRONULAC) 10 GM/15ML solution   Oral   Take 6.67 g by mouth 2 (two) times daily. Dose is14ml         . levofloxacin (LEVAQUIN) 750 MG tablet   Oral   Take 1 tablet (750 mg total) by mouth daily.   6 tablet   0   . levothyroxine (SYNTHROID, LEVOTHROID) 125 MCG tablet   Oral   Take 125 mcg by mouth every morning.         Marland Kitchen levothyroxine (SYNTHROID, LEVOTHROID) 125 MCG tablet   Oral   Take 125 mcg by mouth daily before breakfast.         . LORazepam (ATIVAN) 0.5 MG tablet   Oral   Take 0.5 tablets (0.25 mg total) by mouth every morning. For anxiety and aggegation   30 tablet   0   . LORazepam (ATIVAN) 0.5 MG tablet   Oral   Take 0.25 mg by mouth daily.         . Melatonin 5 MG TABS   Oral   Take 1 tablet by mouth at bedtime.         . Melatonin 5 MG TABS   Oral   Take 1 tablet by mouth at bedtime.         Marland Kitchen nystatin (MYCOSTATIN) powder   Topical   Apply 60 g topically 2 (two) times daily. Apply to rash under arms until healed         . omeprazole (PRILOSEC) 20 MG capsule   Oral   Take 20 mg by mouth daily.           . ondansetron (ZOFRAN) 4 MG  tablet   Oral   Take 4 mg by mouth every 6 (six) hours as needed for nausea or vomiting.         Marland Kitchen PARoxetine (PAXIL) 40 MG tablet   Oral   Take 1 tablet (40 mg total) by mouth at bedtime.   30 tablet   0   . ranitidine (ZANTAC) 150 MG tablet   Oral   Take 150 mg by mouth 2 (two) times daily.         . risperiDONE (RISPERDAL) 0.5 MG tablet   Oral   Take 0.5-1 mg by mouth 2 (two) times daily. Pt takes one tablet in the morning and two tablets at bedtime.         . risperiDONE (RISPERDAL) 1 MG tablet   Oral   Take 1 tablet (1 mg total) by mouth 2 (two) times daily.   30 tablet   0   . sertraline (ZOLOFT) 50 MG tablet   Oral   Take 1 tablet (50 mg total) by mouth daily.   30 tablet   0   . sertraline (ZOLOFT) 50 MG tablet   Oral   Take 50 mg by mouth at bedtime.           Allergies Review of patient's allergies indicates no known allergies.  Family History  Problem Relation Age of Onset  . Cancer Sister     Social History Social History  Substance Use Topics  . Smoking status: Former Games developer  . Smokeless tobacco: Not on file  . Alcohol Use: No    Review of Systems Unable to assess due to patient dementia ____________________________________________   PHYSICAL EXAM:  VITAL SIGNS: ED  Triage Vitals  Enc Vitals Group     BP 07/16/2015 0541 74/53 mmHg     Pulse Rate 07/26/2015 0541 114     Resp 07/28/2015 0541 26     Temp 08/02/2015 0541 96.9 F (36.1 C)     Temp Source 07/23/2015 0541 Oral     SpO2 07/12/2015 0541 97 %     Weight --      Height --      Head Cir --      Peak Flow --      Pain Score --      Pain Loc --      Pain Edu? --      Excl. in GC? --     Constitutional: Alert and oriented to self and location. Thin appearing and in no acute distress. Eyes: Conjunctivae are normal. PERRL. EOMI. Head: Skin tear to right scalp approximately 6 cm in length Nose: No congestion/rhinnorhea. Mouth/Throat: Mucous membranes are moist.  Oropharynx  non-erythematous. Cardiovascular: Normal rate, regular rhythm. Grossly normal heart sounds.  Good peripheral circulation. Respiratory: Normal respiratory effort.  No retractions. Lungs CTAB. Gastrointestinal: Soft and nontender. No distention.  Musculoskeletal: No lower extremity tenderness nor edema.   Neurologic:  Normal speech and language.  Skin:  Skin is warm, dry laceration to right scalp  Psychiatric: Mood and affect are normal.   ____________________________________________   LABS (all labs ordered are listed, but only abnormal results are displayed)  Labs Reviewed  CBC - Abnormal; Notable for the following:    WBC 15.2 (*)    RBC 3.57 (*)    Hemoglobin 10.2 (*)    HCT 31.2 (*)    RDW 14.8 (*)    Platelets 450 (*)    All other components within normal limits  COMPREHENSIVE METABOLIC PANEL - Abnormal; Notable for the following:    Sodium 147 (*)    Glucose, Bld 109 (*)    BUN 25 (*)    Creatinine, Ser 1.61 (*)    Albumin 2.5 (*)    GFR calc non Af Amer 37 (*)    GFR calc Af Amer 43 (*)    All other components within normal limits  TROPONIN I  URINALYSIS COMPLETEWITH MICROSCOPIC ([facility] ONLY)   ____________________________________________  EKG  ED ECG REPORT I, Rebecka Apley, the attending physician, personally viewed and interpreted this ECG.   Date: 07/17/2015  EKG Time: 544  Rate: 96  Rhythm: atrial fibrillation, rate 96  Axis: normal  Intervals:none  ST&T Change: flipped t waves II,III,avf, V4, V5, V6  ____________________________________________  RADIOLOGY  CT head and cervical spine moderate right frontal scalp hematoma, no skull fracture, no acute intracranial process negative CT head for age. No acute cervical spine fracture grade I C6 on C7 anterolisthesis on degenerative base. ____________________________________________   PROCEDURES  Procedure(s) performed: None  Critical Care performed: Yes, see critical care note(s)   CRITICAL  CARE Performed by: Lucrezia Europe P   Total critical care time:  Critical care time was exclusive of separately billable procedures and treating other patients.  Critical care was necessary to treat or prevent imminent or life-threatening deterioration.  Critical care was time spent personally by me on the following activities: development of treatment plan with patient and/or surrogate as well as nursing, discussions with consultants, evaluation of patient's response to treatment, examination of patient, obtaining history from patient or surrogate, ordering and performing treatments and interventions, ordering and review of laboratory studies, ordering and  review of radiographic studies, pulse oximetry and re-evaluation of patient's condition. care ____________________________________________   INITIAL IMPRESSION / ASSESSMENT AND PLAN / ED COURSE  Pertinent labs & imaging results that were available during my care of the patient were reviewed by me and considered in my medical decision making (see chart for details).  The patient is an 81 year old male with Alzheimer's dementia who fell today. When the patient arrived to the hospital although EMS had a normal blood pressure the patient's blood pressures in the 70s on 40s. I did give him a liter of normal saline and evaluated him with blood work. The patient does avoid blood cell count of 15.2 with some hypernatremia. The patient's creatinine is also slightly elevated at 1.61. The patient did have some black stuff around his mouth which was concerning for possible GI bleed. We gave the patient liter of normal saline and we'll check some urine for possible infection. After the first liter of normal saline the patient's blood pressure did improve to the 110s  The patient does not have a history of atrial fibrillation. Given his initial hypotension i will give him a small  dose of diltiazem and amiodarone as discussed with the  hospitalist. I will admit him to the hospitalist service. He also had a positive hemoccult. ____________________________________________   FINAL CLINICAL IMPRESSION(S) / ED DIAGNOSES  Final diagnoses:  Atrial fibrillation with RVR (HCC)  Gastrointestinal hemorrhage associated with gastritis, unspecified gastritis  Scalp hematoma, initial encounter  Acute renal insufficiency      Rebecka Apley, MD 07/07/2015 0750  Rebecka Apley, MD 07/13/2015 [phone]

## 2015-07-06 NOTE — Progress Notes (Signed)
81 yo wm admitted from ED s/p Fall and Afib.  From [facility].  Alert, oriented to person/place only.  Spoke with niece, Leatrice Jewels who is is POA.  No distress on 2LO2 per [place]. Lungs diminished with scattered rhonchi, pt has wet/congested cough.  Abdomen soft with active bowel sounds x 4.  Laceration to forehead with steri strips in place.  Bruise to rt shoulder, abrasion to rt knee x2, and rt elbow with dry dressing inplace, abrasion to rt hand.  Skin checked with 2nd nurse, Jennette Dubin. RN.  IVF infusing well lt ac.  CB in reach, SR up x3 , bed alarm on.

## 2015-07-06 NOTE — Progress Notes (Signed)
When arriving from 2A after settling patient in new bed, o2 sats 80% on 2L therefore ventimask at 50% placed which only brought o2 sats up to 85% therefore Amy RT was called and placed patient on bipap at 100%.  Patient now sating 95% on bipap. Will continue to monitor. Dr. Belia Heman in to assess patient at this time.

## 2015-07-06 NOTE — Progress Notes (Signed)
RN spoke with Dr. Winona Legato on the phone and made MD aware that patient's blood pressure is 88/58 with MAP of 68 but that MAP fluctuates 62-upper 60's and that patient has made no urine and bladder scan result was .  RN also read MD chest xray results per her request. Dr. Winona Legato stated "make sure he is getting IVF at 100cc/H normal saline and insert foley catheter, place palliative care consult and maintain MAP > or equal to 65."

## 2015-07-07 ENCOUNTER — Inpatient Hospital Stay: Payer: Medicare Other

## 2015-07-07 ENCOUNTER — Encounter: Payer: Self-pay | Admitting: *Deleted

## 2015-07-07 DIAGNOSIS — E038 Other specified hypothyroidism: Secondary | ICD-10-CM

## 2015-07-07 DIAGNOSIS — J690 Pneumonitis due to inhalation of food and vomit: Secondary | ICD-10-CM

## 2015-07-07 DIAGNOSIS — I4891 Unspecified atrial fibrillation: Secondary | ICD-10-CM

## 2015-07-07 DIAGNOSIS — J811 Chronic pulmonary edema: Secondary | ICD-10-CM

## 2015-07-07 DIAGNOSIS — W19XXXS Unspecified fall, sequela: Secondary | ICD-10-CM

## 2015-07-07 DIAGNOSIS — R1310 Dysphagia, unspecified: Secondary | ICD-10-CM

## 2015-07-07 DIAGNOSIS — I959 Hypotension, unspecified: Secondary | ICD-10-CM

## 2015-07-07 DIAGNOSIS — Z87891 Personal history of nicotine dependence: Secondary | ICD-10-CM

## 2015-07-07 DIAGNOSIS — J9691 Respiratory failure, unspecified with hypoxia: Secondary | ICD-10-CM

## 2015-07-07 LAB — CBC
HCT: 26.7 % — ABNORMAL LOW (ref 40.0–52.0)
HEMOGLOBIN: 8.9 g/dL — AB (ref 13.0–18.0)
MCH: 29.4 pg (ref 26.0–34.0)
MCHC: 33.3 g/dL (ref 32.0–36.0)
MCV: 88.4 fL (ref 80.0–100.0)
Platelets: 380 10*3/uL (ref 150–440)
RBC: 3.02 MIL/uL — AB (ref 4.40–5.90)
RDW: 15.3 % — ABNORMAL HIGH (ref 11.5–14.5)
WBC: 15.6 10*3/uL — ABNORMAL HIGH (ref 3.8–10.6)

## 2015-07-07 LAB — BASIC METABOLIC PANEL
ANION GAP: 8 (ref 5–15)
BUN: 19 mg/dL (ref 6–20)
CALCIUM: 8.5 mg/dL — AB (ref 8.9–10.3)
CHLORIDE: 115 mmol/L — AB (ref 101–111)
CO2: 24 mmol/L (ref 22–32)
Creatinine, Ser: 1.18 mg/dL (ref 0.61–1.24)
GFR calc non Af Amer: 54 mL/min — ABNORMAL LOW (ref >60)
GLUCOSE: 114 mg/dL — AB (ref 65–99)
Potassium: 3.6 mmol/L (ref 3.5–5.1)
Sodium: 147 mmol/L — ABNORMAL HIGH (ref 135–145)

## 2015-07-07 MED ORDER — LORAZEPAM 2 MG/ML IJ SOLN
0.5000 mg | INTRAMUSCULAR | Status: DC | PRN
  Administered 2015-07-07 – 2015-07-08 (×2): 0.5 mg via INTRAVENOUS
  Filled 2015-07-07 (×2): qty 1

## 2015-07-07 MED ORDER — POLYVINYL ALCOHOL 1.4 % OP SOLN: 1.0000 [drp] | Freq: Four times a day (QID) | OPHTHALMIC | Status: DC | PRN

## 2015-07-07 MED ORDER — GLYCOPYRROLATE 1 MG PO TABS
1.0000 mg | ORAL_TABLET | ORAL | Status: DC | PRN
  Filled 2015-07-07: qty 1

## 2015-07-07 MED ORDER — MIDAZOLAM HCL 2 MG/2ML IJ SOLN
2.0000 mg | INTRAMUSCULAR | Status: AC
  Administered 2015-07-07: 2 mg via INTRAVENOUS

## 2015-07-07 MED ORDER — MORPHINE SULFATE (CONCENTRATE) 10 MG/0.5ML PO SOLN: 5.0000 mg | ORAL | Status: DC | PRN

## 2015-07-07 MED ORDER — SODIUM CHLORIDE 0.9 % IJ SOLN
3.0000 mL | Freq: Two times a day (BID) | INTRAMUSCULAR | Status: DC
  Administered 2015-07-07: 3 mL via INTRAVENOUS

## 2015-07-07 MED ORDER — BIOTENE DRY MOUTH MT LIQD: 15.0000 mL | OROMUCOSAL | Status: DC | PRN

## 2015-07-07 MED ORDER — SODIUM CHLORIDE 0.9 % IJ SOLN: 3.0000 mL | INTRAMUSCULAR | Status: DC | PRN

## 2015-07-07 MED ORDER — ACETAMINOPHEN 650 MG RE SUPP: 650.0000 mg | Freq: Four times a day (QID) | RECTAL | Status: DC | PRN

## 2015-07-07 MED ORDER — MORPHINE SULFATE (CONCENTRATE) 10 MG/0.5ML PO SOLN
5.0000 mg | Freq: Four times a day (QID) | ORAL | Status: DC
  Administered 2015-07-08: 11:00:00 5 mg via ORAL
  Filled 2015-07-07: qty 1

## 2015-07-07 MED ORDER — GLYCOPYRROLATE 0.2 MG/ML IJ SOLN
0.2000 mg | INTRAMUSCULAR | Status: DC | PRN
  Filled 2015-07-07: qty 1

## 2015-07-07 MED ORDER — ENOXAPARIN SODIUM 40 MG/0.4ML ~~LOC~~ SOLN
40.0000 mg | SUBCUTANEOUS | Status: DC
Start: 2015-07-07 — End: 2015-07-07

## 2015-07-07 MED ORDER — ACETAMINOPHEN 325 MG PO TABS: 650.0000 mg | ORAL_TABLET | Freq: Four times a day (QID) | ORAL | Status: DC | PRN

## 2015-07-07 MED ORDER — SODIUM CHLORIDE 0.9 % IV SOLN: Freq: Once | INTRAVENOUS | Status: DC

## 2015-07-07 MED ORDER — MIDAZOLAM HCL 2 MG/2ML IJ SOLN
INTRAMUSCULAR | Status: AC
  Filled 2015-07-07: qty 2

## 2015-07-07 MED ORDER — MORPHINE SULFATE (PF) 2 MG/ML IV SOLN
INTRAVENOUS | Status: AC
  Administered 2015-07-07: 2 mg via INTRAVENOUS
  Filled 2015-07-07: qty 1

## 2015-07-07 MED ORDER — MORPHINE SULFATE (PF) 2 MG/ML IV SOLN
2.0000 mg | INTRAVENOUS | Status: DC | PRN
  Administered 2015-07-07 – 2015-07-08 (×7): 2 mg via INTRAVENOUS
  Filled 2015-07-07 (×6): qty 1

## 2015-07-07 MED ORDER — LEVOTHYROXINE SODIUM 100 MCG IV SOLR: 75.0000 ug | Freq: Every day | INTRAVENOUS | Status: DC

## 2015-07-07 MED ORDER — SODIUM CHLORIDE 0.9 % IV SOLN: 250.0000 mL | INTRAVENOUS | Status: DC | PRN

## 2015-07-07 MED ORDER — MIDAZOLAM HCL 2 MG/2ML IJ SOLN: 4.0000 mg | INTRAMUSCULAR | Status: DC | PRN

## 2015-07-07 MED ORDER — BISACODYL 10 MG RE SUPP: 10.0000 mg | Freq: Every day | RECTAL | Status: DC | PRN

## 2015-07-07 NOTE — Progress Notes (Signed)
PT Cancellation Note  Patient Details Name: Corey Miller MRN: [id number] DOB: 06-22-1928   Cancelled Treatment:     Patient transferred to ICU after PT orders given. No continue on transfer orders noted in chart review by PT. PT will complete order and await new orders when patient is medically appropriate for mobility evaluation.   Kerin Ransom, PT, DPT    07/07/2015, 8:19 AM

## 2015-07-07 NOTE — Progress Notes (Signed)
Dr. Nicholos Johns placed comfort care orders after talking with niece. IV morphine given for dyspnea but patient continued to breath 38/min therefore RN notified MD and MD gave verbal order for  of IV versed once which was given at 1231. Breathing slowed and patient taken off bipap by Angie, RRT.  Patient now snoring and resting. No distress noted.

## 2015-07-07 NOTE — Consult Note (Signed)
Palliative Medicine Inpatient Consult Note   Name: Corey Miller Date: 07/07/2015 MRN: [id number]  DOB: [birth year]  Referring Physician: Katharina Caper, MD  Palliative Care consult requested for this 81 y.o. male for goals of medical therapy in patient with terminal state due to acute aspiration pneumonia and pulmonary edema due to AFib.    REVIEW OF SYSTEMS:  Patient is not able to provide ROS due to terminal state  SPIRITUAL SUPPORT SYSTEM: Yes niece.  SOCIAL HISTORY:  reports that he has quit smoking. He does not have any smokeless tobacco history on file. He reports that he does not drink alcohol or use illicit drugs.   IMPRESSION: 1. Acute respiratory failure due to aspiration pneumonia and pulmonary edema due to AFib --hypoxic resp failure 2. Afib with RVR 3. Hypotensive due to rapid Afib 4. Dysphagia 5. GERD and h/o GI bleed 6.  Hypothyroidism  PLAN: Pt is DNR I will add more symptom mgmt Rxs after talking with nurse Family is not here. I spoke with Dr. Barrie Dunker about plan.   LEGAL DOCUMENTS:  MOST form is in chart and is reviewed (was signed by his HCPOA, niece ) No HCPOA form is in the chart  CODE STATUS: DNR  PAST MEDICAL HISTORY: Past Medical History  Diagnosis Date  . Arthritis   . Vertigo   . Thyroid disease   . Hypothyroidism   . Dementia   . Hyperlipidemia     PAST SURGICAL HISTORY:  Past Surgical History  Procedure Laterality Date  . Appendectomy      ALLERGIES:  has No Known Allergies.  MEDICATIONS:  Current Facility-Administered Medications  Medication Dose Route Frequency Provider Last Rate Last Dose  . 0.9 %  sodium chloride infusion  250 mL Intravenous PRN Shane Crutch, MD      . acetaminophen (TYLENOL) tablet 650 mg  650 mg Oral Q6H PRN Katharina Caper, MD       Or  . acetaminophen (TYLENOL) suppository 650 mg  650 mg Rectal Q6H PRN Katharina Caper, MD      . acetaminophen (TYLENOL) tablet 650 mg  650 mg Oral Q6H PRN Shane Crutch, MD       Or  . acetaminophen (TYLENOL) suppository 650 mg  650 mg Rectal Q6H PRN Shane Crutch, MD      . glycopyrrolate (ROBINUL) tablet 1 mg  1 mg Oral Q4H PRN Shane Crutch, MD       Or  . glycopyrrolate (ROBINUL) injection 0.2 mg  0.2 mg Subcutaneous Q4H PRN Shane Crutch, MD       Or  . glycopyrrolate (ROBINUL) injection 0.2 mg  0.2 mg Intravenous Q4H PRN Shane Crutch, MD      . ipratropium-albuterol (DUONEB) 0.5-2.5 (3) MG/3ML nebulizer solution 3 mL  3 mL Nebulization Q6H PRN Katharina Caper, MD      . LORazepam (ATIVAN) injection 0.5 mg  0.5 mg Intravenous Q1H PRN Suan Halter, MD      . morphine 2 MG/ML injection 2 mg  2 mg Intravenous Q10 min PRN Shane Crutch, MD   2 mg at 07/07/15 1334  . ondansetron (ZOFRAN) tablet 4 mg  4 mg Oral Q6H PRN Katharina Caper, MD       Or  . ondansetron (ZOFRAN) injection 4 mg  4 mg Intravenous Q6H PRN Katharina Caper, MD      . sodium chloride 0.9 % injection 3 mL  3 mL Intravenous Q12H Shane Crutch, MD   3 mL at 07/07/15 1222  .  sodium chloride 0.9 % injection 3 mL  3 mL Intravenous PRN Shane Crutch, MD        Vital Signs: BP 91/52 mmHg  Pulse 73  Temp(Src) 97.6 F (36.4 C) (Axillary)  Resp 38  Ht  (1.702 m)  Wt 61.7 kg (136 lb 0.4 oz)  BMI 21.30 kg/m2  SpO2 65% Filed Weights   07/15/2015 0928 2015/07/15 1300  Weight: 60.419 kg (133 lb 3.2 oz) 61.7 kg (136 lb 0.4 oz)    Estimated body mass index is 21.3 kg/(m^2) as calculated from the following:   Height as of this encounter:  (1.702 m).   Weight as of this encounter: 61.7 kg (136 lb 0.4 oz).  PERFORMANCE STATUS (ECOG) : 4 - Bedbound  PHYSICAL EXAM: Pt is lying in bed with tele monitor off in room Pupils are deviated upwards Mouth open Respirations slow and shallow Hrt irreg beats Lungs shallow resp but even Abd soft and NT Ext pale w/o mottling so far  LABS: CBC:    Component Value Date/Time   WBC  15.6* 07/07/2015 0451   WBC 9.6 08/26/2014 0413   HGB 8.9* 07/07/2015 0451   HGB 11.9* 08/26/2014 0413   HCT 26.7* 07/07/2015 0451   HCT 34.8* 08/26/2014 0413   PLT 380 07/07/2015 0451   PLT 276 08/26/2014 0413   MCV 88.4 07/07/2015 0451   MCV 93 08/26/2014 0413   NEUTROABS 15.6* 05/07/2015 1552   NEUTROABS 6.1 08/26/2014 0413   LYMPHSABS 1.0 05/07/2015 1552   LYMPHSABS 2.2 08/26/2014 0413   MONOABS 2.0* 05/07/2015 1552   MONOABS 0.9 08/26/2014 0413   EOSABS 0.0 05/07/2015 1552   EOSABS 0.3 08/26/2014 0413   BASOSABS 0.0 05/07/2015 1552   BASOSABS 0.0 08/26/2014 0413   Comprehensive Metabolic Panel:    Component Value Date/Time   NA 147* 07/07/2015 0451   NA 142 08/26/2014 0413   K 3.6 07/07/2015 0451   K 3.8 08/26/2014 0413   CL 115* 07/07/2015 0451   CL 107 08/26/2014 0413   CO2 24 07/07/2015 0451   CO2 25 08/26/2014 0413   BUN 19 07/07/2015 0451   BUN 19* 08/26/2014 0413   CREATININE 1.18 07/07/2015 0451   CREATININE 1.08 08/26/2014 0413   GLUCOSE 114* 07/07/2015 0451   GLUCOSE 121* 08/26/2014 0413   CALCIUM 8.5* 07/07/2015 0451   CALCIUM 8.4* 08/26/2014 0413   AST 37 2015-07-15 0546   AST 18 08/26/2014 0413   ALT 19 07/15/15 0546   ALT 15 08/26/2014 0413   ALKPHOS 92 15-Jul-2015 0546   ALKPHOS 76 08/26/2014 0413   BILITOT 0.6 07/15/2015 0546   BILITOT 0.4 08/26/2014 0413   PROT 7.5 07/15/15 0546   PROT 7.2 08/26/2014 0413   ALBUMIN 2.5* 07/15/2015 0546   ALBUMIN 3.0* 08/26/2014 0413     More than 50% of the visit was spent in counseling/coordination of care: Yes  Time Spent: 50 minutes

## 2015-07-07 NOTE — Progress Notes (Signed)
Corey Miller, niece just arrived to patient's room and RN spoke with her regarding patient's breathing and made Dr. Nicholos Johns aware that she was in room. MD is in room talking with niece at this time.

## 2015-07-07 NOTE — Progress Notes (Signed)
   07/07/15 1345  Clinical Encounter Type  Visited With Patient  Visit Type Initial;Patient actively dying  Referral From Nurse  Consult/Referral To Chaplain  Spiritual Encounters  Spiritual Needs Emotional  Stress Factors  Patient Stress Factors None identified  Chaplain rounded in the unit and offered a compassionate presence. I asked the patient if I could hold his hand and sit with him and he shook his head with a response of no. I obliged and offered him his space. Chaplain Keisuke Hollabaugh A. Haelie Clapp Ext. [phone]

## 2015-07-07 NOTE — Progress Notes (Signed)
Order changed from enoxaparin  to enoxaparin  SubQ based on crcl >68ml/min per hospital policy.  Adjust as needed based on renal function.  Maryjo Rochester, PharmD Clinical Pharmacist 07/07/2015

## 2015-07-07 NOTE — Progress Notes (Signed)
Patient waking up, disoriented but breathing and anxiety improved with ativan. Report called to Dot Lanes, RN on 1C.  Patient being  moved to 1C at this time by bed with Chasity, NT.  This RN notified Brighton Pilley, niece that patient is being moved to 1C and Dr. Orvan Falconer also spoke with her on the phone.

## 2015-07-07 NOTE — Progress Notes (Signed)
ANTIBIOTIC CONSULT NOTE - FOLLOW UP   Pharmacy Consult for vancomycin and piperacillin/tazobactam Indication: Aspiration pneumonia  No Known Allergies  Patient Measurements: Height:  (170.2 cm) Weight: 136 lb 0.4 oz (61.7 kg) IBW/kg (Calculated) : 66.1 Ideal Body Weight: 68.4 kg  Vital Signs: Temp: 98.9 F (37.2 C) (10/04 0400) Temp Source: Axillary (10/04 0400) BP: 91/59 mmHg (10/04 0900) Pulse Rate: 63 (10/04 0900) Intake/Output from previous day: 10/03 0701 - 10/04 0700 In: 3950.2 [I.V.:2500.2; IV Piggyback:1450] Out: 550 [Urine:550] Intake/Output from this shift: Total I/O In: 306 [I.V.:306] Out: -   Labs:  Recent Labs  15-Jul-2015 0546 15-Jul-2015 1326 07-15-2015 1932 07-15-2015 2158 07/07/15 0451  WBC 15.2*  --   --   --  15.6*  HGB 10.2* 8.7* 8.8* 9.0* 8.9*  PLT 450*  --   --   --  380  CREATININE 1.61* 1.33*  --   --  1.18   Estimated Creatinine Clearance: 39.9 mL/min (by C-G formula based on Cr of 1.18). No results for input(s): VANCOTROUGH, VANCOPEAK, VANCORANDOM, GENTTROUGH, GENTPEAK, GENTRANDOM, TOBRATROUGH, TOBRAPEAK, TOBRARND, AMIKACINPEAK, AMIKACINTROU, AMIKACIN in the last 72 hours.   Microbiology: Recent Results (from the past 720 hour(s))  MRSA PCR Screening     Status: None   Collection Time: 15-Jul-2015 12:32 PM  Result Value Ref Range Status   MRSA by PCR NEGATIVE NEGATIVE Final    Comment:        The GeneXpert MRSA Assay (FDA approved for NASAL specimens only), is one component of a comprehensive MRSA colonization surveillance program. It is not intended to diagnose MRSA infection nor to guide or monitor treatment for MRSA infections.     Medical History: Past Medical History  Diagnosis Date  . Arthritis   . Vertigo   . Thyroid disease   . Hypothyroidism   . Dementia   . Hyperlipidemia     Medications:  Anti-infectives    Start     Dose/Rate Route Frequency Ordered Stop   07/07/15 0400  vancomycin (VANCOCIN) IVPB 750 mg/150  ml premix     750 mg 150 mL/hr over 60 Minutes Intravenous Every 24 hours 15-Jul-2015 1622     07/07/15 0030  piperacillin-tazobactam (ZOSYN) IVPB 3.375 g     3.375 g 12.5 mL/hr over 240 Minutes Intravenous 3 times per day Jul 15, 2015 1557     07/15/15 1700  vancomycin (VANCOCIN) IVPB 1000 mg/200 mL premix     1,000 mg 200 mL/hr over 60 Minutes Intravenous  Once 07/15/15 1604 15-Jul-2015 1805   2015-07-15 1630  piperacillin-tazobactam (ZOSYN) IVPB 3.375 g     3.375 g 100 mL/hr over 30 Minutes Intravenous  Once 07-15-15 1555 07-15-2015 1658     Assessment: Pharmacy consulted to dose vancomycin and piperacillin/tazobactam in this 81 year old man for aspiration pneumonia.  Scr improved overnight   Updated PK: Kel (hr-1): 0.038 Half-life (hrs): 18.24 Vd (liters): 43.19 (factor used: 0.7 L/kg)    Goal of Therapy:  Vancomycin trough level 15-20 mcg/ml  Plan:  Continue Zosyn 3.375 g IV q8 hours.   Will continue Vancomycin 750 mg IV q24 hours. May need to start q18 hours dosing if patient's renal function continues to improve.     Danyela Posas D 07/07/2015,9:09 AM

## 2015-07-07 NOTE — Progress Notes (Signed)
1510- Report received from [place], RN in ICU.  Received via bed. HOH. Arouses to Verbal Stimuli. No signs of distress noted.  Spoke with [place], RN regarding DNR, but no Comfort Measure Order. Dr. Orvan Falconer placed Comfort Measure Order.  Monitoring patient. No family at bedside.

## 2015-07-07 NOTE — Progress Notes (Signed)
RN notified Angie, RT that patient is desating on bipap after bath and o2 sats are 85%. Angie, RT went in patient's room and adjusted pressure on bipap settings,  fio2 already at 100%. Will continue to monitor.

## 2015-07-07 NOTE — Progress Notes (Signed)
RT called to room to discontinue Bipap.  Patient has been made comfort care only and received Morphine and Versed.  Bipap removed, patient breathing has slowed, no distress noted.  RN [place] remained in room.

## 2015-07-07 NOTE — Progress Notes (Signed)
RN notified Dr. Nicholos Johns in person that patient continues to desat down to 79% with fio2 at 100% on bipap.  Patient arouses to voice but is breathing 39/min with increased work of breathing with retractions.  Dr. Nicholos Johns saw patient in room but gave no new orders and stated "i will call the niece."

## 2015-07-07 NOTE — Progress Notes (Addendum)
[facility]  Critical Care Medicine Progess Note    ASSESSMENT/PLAN   81 yo white male admitted to ICU for acute resp failure with acute afib wth RVR likely dx with possible acute aspiration pneumonia with acute pulmonary edema from afib.  PULMONARY -Respiratory Failure-high risk for intubation -continue biPAP support, the patient has hypoxic respiratory failure, possibly due to aspiration pneumonia. -The patient was weaned down to BiPAP as low as 50%, but is now back up to 100% support.  CARDIOVASCULAR Afib with RVR, now controlled on Amiodarone drip.  -Patient was previously hypotensive and was on Levophed, we will wean down as tolerated  RENAL -follow UO -follow chem 7  GASTROINTESTINAL -Chronic dysphagia, now with likely aspiration. -Currently on a Protonix drip due to coffee ground emesis.   HEMATOLOGIC Follow CBC  INFECTIOUS Probable pneumonia -Continue start zosyn/vancomycin  ENDOCRINE -History of hypothyroidism. Will change by mouth thyroxine to IV. - ICU hypoglycemic\Hyperglycemia protocol   NEUROLOGIC Avoid sedatives   Addendum I discussed the case with the hospitalist physician, critical care RN, and subsequently the patient's niece who was at the bedside and her power of attorney. She agrees that the patient's status has been progressively declining over the last few months and his baseline functioning was very poor. She believes as well as the patient's sister, that his quality of life is poor and he would not want to be resuscitated. The patient is currently DO NOT RESUSCITATE. However, since his arrival to the unit. His status has continued to deteriorate. He is currently on a 100% BiPAP and currently minimally to unresponsive. His chance of significant recovery is very poor , particularly given his aspiration and dysphagia. He is likely to deteriorate once again even if he recovers.  -Patient's power of attorney is in agreement that would be reasonable to  make the patient comfort measures only. Therefore, the patient will be given sedative medications and taken off BiPAP and weaned off of Levophed. She understands that the patient may pass away in the next few hours. ---------------------------------------   ----------------------------------------   Name: Corey Miller MRN: [id number] DOB: 09/19/1928    ADMISSION DATE:  07/23/2015    CHIEF COMPLAINT: Dyspnea   STUDIES:  Reviewed chest x-ray from 07/07/2015 showed left-sided pneumonia.   SUBJECTIVE:   Pt currently on BiPAP, can not provide history or review of systems.   Review of Systems:  Pt currently on BiPAP, can not provide history or review of systems.   VITAL SIGNS: Temp:  [97.6 F (36.4 C)-98.9 F (37.2 C)] 97.6 F (36.4 C) (10/04 0845) Pulse Rate:  [62-114] 92 (10/04 1100) Resp:  [17-35] 28 (10/04 1100) BP: (77-140)/(35-72) 100/57 mmHg (10/04 1100) SpO2:  [81 %-100 %] 86 % (10/04 1100) FiO2 (%):  [50 %-100 %] 60 % (10/04 0800) Weight:  [61.7 kg (136 lb 0.4 oz)] 61.7 kg (136 lb 0.4 oz) (10/03 1300) HEMODYNAMICS:   VENTILATOR SETTINGS: Vent Mode:  [-]  FiO2 (%):  [50 %-100 %] 60 % INTAKE / OUTPUT:  Intake/Output Summary (Last 24 hours) at 07/07/15 1125 Last data filed at 07/07/15 1100  Gross per 24 hour  Intake 4608.02 ml  Output    551 ml  Net 4057.02 ml    PHYSICAL EXAMINATION: Physical Examination:   VS: BP 100/57 mmHg  Pulse 92  Temp(Src) 97.6 F (36.4 C) (Axillary)  Resp 28  Ht  (1.702 m)  Wt 61.7 kg (136 lb 0.4 oz)  BMI 21.30 kg/m2  SpO2 86%  General Appearance:  No distress  Neuro:without focal findings, mental status , reduced HEENT: PERRLA, EOM intact. Pulmonary: Decreased breath sounds in left lung  CardiovascularNormal S1,S2.  No m/r/g.   Abdomen: Benign, Soft, non-tender. Renal:  No costovertebral tenderness  GU:  Not performed at this time. Endocrine: No evident thyromegaly. Skin:   warm, no rashes, no ecchymosis    Extremities: normal, no cyanosis, clubbing.   LABS:   LABORATORY PANEL:   CBC  Recent Labs Lab 07/07/15 0451  WBC 15.6*  HGB 8.9*  HCT 26.7*  PLT 380    Chemistries   Recent Labs Lab 07/09/2015 0546  07/07/15 0451  NA 147*  < > 147*  K 4.1  < > 3.6  CL 104  < > 115*  CO2 30  < > 24  GLUCOSE 109*  < > 114*  BUN 25*  < > 19  CREATININE 1.61*  < > 1.18  CALCIUM 9.5  < > 8.5*  MG 2.2  --   --   AST 37  --   --   ALT 19  --   --   ALKPHOS 92  --   --   BILITOT 0.6  --   --   < > = values in this interval not displayed.  No results for input(s): GLUCAP in the last 168 hours.  Recent Labs Lab 07/10/2015 1605  PHART 7.38  PCO2ART 40  PO2ART 208*    Recent Labs Lab 07/14/2015 0546  AST 37  ALT 19  ALKPHOS 92  BILITOT 0.6  ALBUMIN 2.5*    Cardiac Enzymes  Recent Labs Lab 07/25/2015 2158  TROPONINI <0.03    RADIOLOGY:  Ct Head Wo Contrast  07/18/2015   CLINICAL DATA:  Larey Seat this morning. Top of head laceration. History of vertigo, dementia, hyperlipidemia.  EXAM: CT HEAD WITHOUT CONTRAST  CT CERVICAL SPINE WITHOUT CONTRAST  TECHNIQUE: Multidetector CT imaging of the head and cervical spine was performed following the standard protocol without intravenous contrast. Multiplanar CT image reconstructions of the cervical spine were also generated.  COMPARISON:  None.  FINDINGS: CT HEAD FINDINGS  The ventricles and sulci are normal for age. No intraparenchymal hemorrhage, mass effect nor midline shift. Patchy supratentorial white matter hypodensities are less than expected for patient's age and though non-specific suggest sequelae of chronic small vessel ischemic disease. No acute large vascular territory infarcts.  No abnormal extra-axial fluid collections. Basal cisterns are patent. Mild to moderate calcific atherosclerosis of the carotid siphons.  Moderate RIGHT frontal scalp hematoma without subcutaneous gas or radiopaque foreign bodies. No skull fracture. The  included ocular globes and orbital contents are non-suspicious. Status post bilateral ocular lens implants. Mild RIGHT maxillary mucosal thickening without paranasal sinus air-fluid levels. The mastoid air cells are well aerated. Patient is edentulous. 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 Tiny tubular structure in the central clivus suggest nodal cord remanent.  CT CERVICAL SPINE FINDINGS  Cervical vertebral bodies and posterior elements are intact. Maintenance of cervical lordosis. Grade 1 C6-7 anterolisthesis without spondylolysis. LEFT C2-3, RIGHT C3-4 facets are fused on degenerative basis. Multilevel severe facet arthropathy. C1-2 articulation maintained with severe atlantodental osteoarthrosis. No prevertebral soft tissue swelling. Moderate calcific atherosclerosis the RIGHT carotid bulb. Large C4-5 ventral osteophyte deforms the posterior hypopharynx. Irregular scarring of bilateral lung apices.  IMPRESSION: CT HEAD: Moderate RIGHT frontal scalp hematoma.  No skull fracture.  No acute intracranial process, negative CT head for age.  CT CERVICAL SPINE: No acute cervical spine fracture. Grade  1 C6-7 anterolisthesis on degenerative basis.   Electronically Signed   By: Awilda Metro M.D.   On: 07/14/2015 06:27   Ct Cervical Spine Wo Contrast  07-14-15   CLINICAL DATA:  Larey Seat this morning. Top of head laceration. History of vertigo, dementia, hyperlipidemia.  EXAM: CT HEAD WITHOUT CONTRAST  CT CERVICAL SPINE WITHOUT CONTRAST  TECHNIQUE: Multidetector CT imaging of the head and cervical spine was performed following the standard protocol without intravenous contrast. Multiplanar CT image reconstructions of the cervical spine were also generated.  COMPARISON:  None.  FINDINGS: CT HEAD FINDINGS  The ventricles and sulci are normal for age. No intraparenchymal hemorrhage, mass effect nor midline shift. Patchy supratentorial white matter hypodensities are less than expected for patient's  age and though non-specific suggest sequelae of chronic small vessel ischemic disease. No acute large vascular territory infarcts.  No abnormal extra-axial fluid collections. Basal cisterns are patent. Mild to moderate calcific atherosclerosis of the carotid siphons.  Moderate RIGHT frontal scalp hematoma without subcutaneous gas or radiopaque foreign bodies. No skull fracture. The included ocular globes and orbital contents are non-suspicious. Status post bilateral ocular lens implants. Mild RIGHT maxillary mucosal thickening without paranasal sinus air-fluid levels. The mastoid air cells are well aerated. Patient is edentulous. 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 Tiny tubular structure in the central clivus suggest nodal cord remanent.  CT CERVICAL SPINE FINDINGS  Cervical vertebral bodies and posterior elements are intact. Maintenance of cervical lordosis. Grade 1 C6-7 anterolisthesis without spondylolysis. LEFT C2-3, RIGHT C3-4 facets are fused on degenerative basis. Multilevel severe facet arthropathy. C1-2 articulation maintained with severe atlantodental osteoarthrosis. No prevertebral soft tissue swelling. Moderate calcific atherosclerosis the RIGHT carotid bulb. Large C4-5 ventral osteophyte deforms the posterior hypopharynx. Irregular scarring of bilateral lung apices.  IMPRESSION: CT HEAD: Moderate RIGHT frontal scalp hematoma.  No skull fracture.  No acute intracranial process, negative CT head for age.  CT CERVICAL SPINE: No acute cervical spine fracture. Grade 1 C6-7 anterolisthesis on degenerative basis.   Electronically Signed   By: Awilda Metro M.D.   On: 07-14-2015 06:27   Dg Chest Port 1 View  07/07/2015   CLINICAL DATA:  Acute onset of dyspnea.  Initial encounter.  EXAM: PORTABLE CHEST 1 VIEW  COMPARISON:  Chest radiograph performed Jul 14, 2015  FINDINGS: There is left-sided volume loss and diffuse left-sided airspace opacity, as on the recent prior study. This could  reflect mucus plugging, a centrally obstructing mass, or possibly pneumonia and atelectasis. A small left pleural effusion is noted. The right lung appears clear. No pneumothorax is identified.  The cardiomediastinal silhouette is borderline normal in size. No acute osseous abnormalities are seen.  IMPRESSION: Left-sided volume loss and diffuse left-sided airspace opacity again noted. This could reflect mucus plugging, a centrally obstructing mass or possibly pneumonia and atelectasis. Small left pleural effusion noted.   Electronically Signed   By: Roanna Raider M.D.   On: 07/07/2015 06:58   Dg Chest Port 1 View  July 14, 2015   CLINICAL DATA:  Worsening respiratory failure.  EXAM: PORTABLE CHEST 1 VIEW  COMPARISON:  None.  FINDINGS: Diffuse left hemithorax volume loss and airspace opacification with mediastinal shift to the left. The airspace opacity is most pronounced at the left lung base with possible pleural fluid. Clear right lung with mildly prominent interstitial markings. Small linear metallic density overlying the right lateral chest, inferiorly. Mild left shoulder degenerative changes. Thoracic spine degenerative changes.  IMPRESSION: 1. Diffuse left lung airspace opacity and volume  loss with a possible left pleural effusion. Differential considerations include mucous plugging, centrally obstructing mass and a combination of pneumonia and atelectasis. 2. Possible left pleural fluid. 3. Mild chronic interstitial lung disease on the right.   Electronically Signed   By: Beckie Salts M.D.   On: 07/14/2015 13:33   Dg Knee Complete 4 Views Right  07/05/2015   CLINICAL DATA:  Fall with right knee abrasions.  EXAM: RIGHT KNEE - COMPLETE 4+ VIEW  COMPARISON:  None.  FINDINGS: No fracture, dislocation or suspicious focal osseous lesion. No joint effusion. Moderate tricompartmental osteoarthritis, most prominent in the medial compartment. Moderate superior right patellar enthesophyte. Vascular calcifications  are noted in the posterior soft tissues.  IMPRESSION: 1. No fracture, joint effusion or malalignment in the right knee. 2. Moderate tricompartmental right knee osteoarthritis.   Electronically Signed   By: Delbert Phenix M.D.   On: 08/01/2015 08:21       --Wells Guiles, MD.  Pager [phone] [facility] Office Number: [phone]  Santiago Glad, M.D.  Stephanie Acre, M.D.  Billy Fischer, M.D  Critical Care Attestation.  I have personally obtained a history, examined the patient, evaluated laboratory and imaging results, formulated the assessment and plan and placed orders. The Patient requires high complexity decision making for assessment and support, frequent evaluation and titration of therapies, application of advanced monitoring technologies and extensive interpretation of multiple databases. The patient has critical illness that could lead imminently to failure of 1 or more organ systems and requires the highest level of physician preparedness to intervene.  Critical Care Time devoted to patient care services described in this note is 35 minutes and is exclusive of time spent in procedures.

## 2015-07-07 NOTE — Consult Note (Signed)
Chart reviewed. No evidence of ongoing GI bleed stools are brown no emesis. Orders for comfort care noted. No plans for further GI evaluation. We'll go back to pylori serology still pending. Would treat if positive. Continue daily PPI.  We'll sign off reconsulted needed.

## 2015-07-07 NOTE — Progress Notes (Signed)
Pt made Comfort measures only, is being transferred off unit due to shortage of critical care beds.   Will sign off for now, please call if needed.

## 2015-07-07 NOTE — Progress Notes (Signed)
Nutrition Brief Note  Pt triggered for assessment due to Malnutrition Screening Tool >2. Chart reviewed.  Pt now transitioning to comfort care per MD notes. Remains NPO No further nutrition interventions warranted at this time. Will sign off.  Please re-consult as needed.   Romelle Starcher MS, RD, LDN [phone] Pager

## 2015-07-07 NOTE — Progress Notes (Signed)
[facility] [place] [facility]   PATIENT NAME: Montey Ebel    MR#:  [id number]  DATE OF BIRTH:  [birth year]  SUBJECTIVE:  CHIEF COMPLAINT:   Chief Complaint  Patient presents with  . Fall    81 yom PMHx dementia, DNR, pancreatitis presents via EMS from [facility] after staff found pt on floor with right forehead contusion, right elbow and right lower extremity abrasions.    patient is a 81 year old Caucasian male with history of dementia, pancreatitis, who presented after a fall and was noted to be hypotensive, also had some bloody discharge around his lips. He was admitted to the hospital and after admission, he had coffee-ground emesis. Chest x-ray revealed diffuse left-sided volume loss, concerning for mucous plugging, patient was initiated on IV fluids, antibiotic therapy and BiPAP due to hypoxia. He also required little amount of Levophed for hypotension, due to poor perfusion and poor urinary output. Today, remains on the 70- 90% of BiPAP, denies any chest pains, nausea, vomiting, abdominal discomfort. He had a bowel movement which was brown and no coffee-ground emesis was noted. He remains on Protonix IV drip. On arrival to the hospital. He was also noted to be in A. fib, RVR requiring amiodarone IV drip, she remains on amiodarone at present. Patient was seen by gastroenterologist as well as cardiologist who recommended to continue current therapy. Patient's hemoglobin level is relatively stable and 8.9 today in the morning.  Discussed this patient. Therapist yesterday about patient's severe dysphagia to any consistencies of food, recommended to consider alternative means of nutrition including possibly PEG tube placement. Palliative care consultation was requested to discuss this with family.   Review of Systems  Constitutional: Negative for fever, chills and weight loss.  HENT: Negative for congestion.   Eyes: Negative for blurred vision and double  vision.  Respiratory: Negative for cough, sputum production, shortness of breath and wheezing.   Cardiovascular: Negative for chest pain, palpitations, orthopnea, leg swelling and PND.  Gastrointestinal: Negative for nausea, vomiting, abdominal pain, diarrhea, constipation and blood in stool.  Genitourinary: Negative for dysuria, urgency, frequency and hematuria.  Musculoskeletal: Negative for falls.  Neurological: Negative for dizziness, tremors, focal weakness and headaches.  Endo/Heme/Allergies: Does not bruise/bleed easily.  Psychiatric/Behavioral: Negative for depression. The patient does not have insomnia.     VITAL SIGNS: Blood pressure 85/48, pulse 64, temperature 98.9 F (37.2 C), temperature source Axillary, resp. rate 28, height 5\' 7"  (1.702 m), weight 61.7 kg (136 lb 0.4 oz), SpO2 88 %.  PHYSICAL EXAMINATION:   GENERAL:  81 y.o.-year-old patient lying in the bed in moderate to severe respiratory distress on BiPAP mask is taken off , oxygen saturations did drop down to 80s and patient develops abdominal breathing.  EYES: Pupils equal, round, reactive to light and accommodation. No scleral icterus. Extraocular muscles intact. A dental is HEENT: Head atraumatic, normocephalic. Oropharynx and nasopharynx clear, some dry blood was noted around his lips.  NECK:  Supple, no jugular venous distention. No thyroid enlargement, no tenderness.  LUNGS: Diminished breath sounds on the left, however, good air entrance on the right, no wheezing, rales,rhonchi or crepitation. Using accessory muscles of respiration, especially whenever off oxygen support.  CARDIOVASCULAR: S1, S2 , irregularly irregular, tachycardic. No murmurs, rubs, or gallops.  ABDOMEN: Soft, nontender, nondistended. Bowel sounds present. No organomegaly or mass.  EXTREMITIES: No pedal edema, cyanosis, or clubbing.  NEUROLOGIC: Cranial nerves II through XII are intact. Muscle strength 5/5 in all extremities.  Sensation intact.  Gait not checked.  PSYCHIATRIC: The patient is alert and oriented x 3. Able to follow some commands and answers a few questions SKIN: No obvious rash, lesion, or ulcer.   ORDERS/RESULTS REVIEWED:   CBC  Recent Labs Lab 07/18/2015 0546 08/03/2015 1326 07/18/2015 1932 07/17/2015 2158 07/07/15 0451  WBC 15.2*  --   --   --  15.6*  HGB 10.2* 8.7* 8.8* 9.0* 8.9*  HCT 31.2* 27.7* 27.4* 28.9* 26.7*  PLT 450*  --   --   --  380  MCV 87.4  --   --   --  88.4  MCH 28.5  --   --   --  29.4  MCHC 32.6  --   --   --  33.3  RDW 14.8*  --   --   --  15.3*   ------------------------------------------------------------------------------------------------------------------  Chemistries   Recent Labs Lab 07/25/2015 0546 07/13/2015 1326 07/07/15 0451  NA 147* 141 147*  K 4.1 3.8 3.6  CL 104 109 115*  CO2 30 23 24   GLUCOSE 109* 82 114*  BUN 25* 22* 19  CREATININE 1.61* 1.33* 1.18  CALCIUM 9.5 8.0* 8.5*  MG 2.2  --   --   AST 37  --   --   ALT 19  --   --   ALKPHOS 92  --   --   BILITOT 0.6  --   --    ------------------------------------------------------------------------------------------------------------------ estimated creatinine clearance is 39.9 mL/min (by C-G formula based on Cr of 1.18). ------------------------------------------------------------------------------------------------------------------  Recent Labs  07/21/2015 1054  TSH 0.645    Cardiac Enzymes  Recent Labs Lab 07/21/2015 1054 07/09/2015 1326 07/16/2015 2158  TROPONINI <0.03 0.03 <0.03   ------------------------------------------------------------------------------------------------------------------ Invalid input(s): POCBNP ---------------------------------------------------------------------------------------------------------------  RADIOLOGY: Ct Head Wo Contrast  08/02/2015   CLINICAL DATA:  Larey Seat this morning. Top of head laceration. History of vertigo, dementia, hyperlipidemia.  EXAM: CT HEAD WITHOUT  CONTRAST  CT CERVICAL SPINE WITHOUT CONTRAST  TECHNIQUE: Multidetector CT imaging of the head and cervical spine was performed following the standard protocol without intravenous contrast. Multiplanar CT image reconstructions of the cervical spine were also generated.  COMPARISON:  None.  FINDINGS: CT HEAD FINDINGS  The ventricles and sulci are normal for age. No intraparenchymal hemorrhage, mass effect nor midline shift. Patchy supratentorial white matter hypodensities are less than expected for patient's age and though non-specific suggest sequelae of chronic small vessel ischemic disease. No acute large vascular territory infarcts.  No abnormal extra-axial fluid collections. Basal cisterns are patent. Mild to moderate calcific atherosclerosis of the carotid siphons.  Moderate RIGHT frontal scalp hematoma without subcutaneous gas or radiopaque foreign bodies. No skull fracture. The included ocular globes and orbital contents are non-suspicious. Status post bilateral ocular lens implants. Mild RIGHT maxillary mucosal thickening without paranasal sinus air-fluid levels. The mastoid air cells are well aerated. Patient is edentulous. 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 Tiny tubular structure in the central clivus suggest nodal cord remanent.  CT CERVICAL SPINE FINDINGS  Cervical vertebral bodies and posterior elements are intact. Maintenance of cervical lordosis. Grade 1 C6-7 anterolisthesis without spondylolysis. LEFT C2-3, RIGHT C3-4 facets are fused on degenerative basis. Multilevel severe facet arthropathy. C1-2 articulation maintained with severe atlantodental osteoarthrosis. No prevertebral soft tissue swelling. Moderate calcific atherosclerosis the RIGHT carotid bulb. Large C4-5 ventral osteophyte deforms the posterior hypopharynx. Irregular scarring of bilateral lung apices.  IMPRESSION: CT HEAD: Moderate RIGHT frontal scalp hematoma.  No skull fracture.  No acute intracranial  process,  negative CT head for age.  CT CERVICAL SPINE: No acute cervical spine fracture. Grade 1 C6-7 anterolisthesis on degenerative basis.   Electronically Signed   By: Awilda Metro M.D.   On: 2015-07-23 06:27   Ct Cervical Spine Wo Contrast  07/23/15   CLINICAL DATA:  Larey Seat this morning. Top of head laceration. History of vertigo, dementia, hyperlipidemia.  EXAM: CT HEAD WITHOUT CONTRAST  CT CERVICAL SPINE WITHOUT CONTRAST  TECHNIQUE: Multidetector CT imaging of the head and cervical spine was performed following the standard protocol without intravenous contrast. Multiplanar CT image reconstructions of the cervical spine were also generated.  COMPARISON:  None.  FINDINGS: CT HEAD FINDINGS  The ventricles and sulci are normal for age. No intraparenchymal hemorrhage, mass effect nor midline shift. Patchy supratentorial white matter hypodensities are less than expected for patient's age and though non-specific suggest sequelae of chronic small vessel ischemic disease. No acute large vascular territory infarcts.  No abnormal extra-axial fluid collections. Basal cisterns are patent. Mild to moderate calcific atherosclerosis of the carotid siphons.  Moderate RIGHT frontal scalp hematoma without subcutaneous gas or radiopaque foreign bodies. No skull fracture. The included ocular globes and orbital contents are non-suspicious. Status post bilateral ocular lens implants. Mild RIGHT maxillary mucosal thickening without paranasal sinus air-fluid levels. The mastoid air cells are well aerated. Patient is edentulous. 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 Tiny tubular structure in the central clivus suggest nodal cord remanent.  CT CERVICAL SPINE FINDINGS  Cervical vertebral bodies and posterior elements are intact. Maintenance of cervical lordosis. Grade 1 C6-7 anterolisthesis without spondylolysis. LEFT C2-3, RIGHT C3-4 facets are fused on degenerative basis. Multilevel severe facet arthropathy. C1-2  articulation maintained with severe atlantodental osteoarthrosis. No prevertebral soft tissue swelling. Moderate calcific atherosclerosis the RIGHT carotid bulb. Large C4-5 ventral osteophyte deforms the posterior hypopharynx. Irregular scarring of bilateral lung apices.  IMPRESSION: CT HEAD: Moderate RIGHT frontal scalp hematoma.  No skull fracture.  No acute intracranial process, negative CT head for age.  CT CERVICAL SPINE: No acute cervical spine fracture. Grade 1 C6-7 anterolisthesis on degenerative basis.   Electronically Signed   By: Awilda Metro M.D.   On: Jul 23, 2015 06:27   Dg Chest Port 1 View  07/07/2015   CLINICAL DATA:  Acute onset of dyspnea.  Initial encounter.  EXAM: PORTABLE CHEST 1 VIEW  COMPARISON:  Chest radiograph performed 07-23-2015  FINDINGS: There is left-sided volume loss and diffuse left-sided airspace opacity, as on the recent prior study. This could reflect mucus plugging, a centrally obstructing mass, or possibly pneumonia and atelectasis. A small left pleural effusion is noted. The right lung appears clear. No pneumothorax is identified.  The cardiomediastinal silhouette is borderline normal in size. No acute osseous abnormalities are seen.  IMPRESSION: Left-sided volume loss and diffuse left-sided airspace opacity again noted. This could reflect mucus plugging, a centrally obstructing mass or possibly pneumonia and atelectasis. Small left pleural effusion noted.   Electronically Signed   By: Roanna Raider M.D.   On: 07/07/2015 06:58   Dg Chest Port 1 View  Jul 23, 2015   CLINICAL DATA:  Worsening respiratory failure.  EXAM: PORTABLE CHEST 1 VIEW  COMPARISON:  None.  FINDINGS: Diffuse left hemithorax volume loss and airspace opacification with mediastinal shift to the left. The airspace opacity is most pronounced at the left lung base with possible pleural fluid. Clear right lung with mildly prominent interstitial markings. Small linear metallic density overlying the right  lateral chest, inferiorly. Mild left  shoulder degenerative changes. Thoracic spine degenerative changes.  IMPRESSION: 1. Diffuse left lung airspace opacity and volume loss with a possible left pleural effusion. Differential considerations include mucous plugging, centrally obstructing mass and a combination of pneumonia and atelectasis. 2. Possible left pleural fluid. 3. Mild chronic interstitial lung disease on the right.   Electronically Signed   By: Beckie Salts M.D.   On: 16-Jul-2015 13:33   Dg Knee Complete 4 Views Right  Jul 16, 2015   CLINICAL DATA:  Fall with right knee abrasions.  EXAM: RIGHT KNEE - COMPLETE 4+ VIEW  COMPARISON:  None.  FINDINGS: No fracture, dislocation or suspicious focal osseous lesion. No joint effusion. Moderate tricompartmental osteoarthritis, most prominent in the medial compartment. Moderate superior right patellar enthesophyte. Vascular calcifications are noted in the posterior soft tissues.  IMPRESSION: 1. No fracture, joint effusion or malalignment in the right knee. 2. Moderate tricompartmental right knee osteoarthritis.   Electronically Signed   By: Delbert Phenix M.D.   On: Jul 16, 2015 08:21    EKG:  Orders placed or performed during the hospital encounter of 2015-07-16  . ED EKG  . ED EKG    ASSESSMENT AND PLAN:  Principal Problem:   Fall Active Problems:   Atrial fibrillation (HCC)   Hypotension   Acute renal insufficiency   Guaiac positive stools   Leukocytosis   Anemia 1. Acute posthemorrhagic anemia, she has hemoglobin level is lower than it was on admission, however, and does not require transfusion. I discussed patient's case with his family yesterday who was agreeable for blood transfusion if needed 2. Gastrointestinal bleed. Continue Protonix. Patient hasn't used alcohol heavily in the past according to patient's family, off octreotide. Patient was seen by gastroenterologist who would proceed to EGD if active bleeding, which is not seen 3. A. fib,  RVR, continue Amiodarone intravenously until able to changed to oral versus PEG tube dose, heart rate is relatively well controlled in 90s to 100s, appreciate her cardiologist and put 4. Acute respiratory failure with hypoxia, continue BiPAP. We'll get CT scan of the chest to rule out mucus plugging of main bronchus. Will get pulmonary involved for further recommendations, even bronchoscopy,  for which patient would need to be intubated if needed.  5. Dysphagia with aspirations with any consistencies, palliative care is consulted to discuss this family alternative means of nutrition including PEG tube placement. If PEG tube placement is not a consideration, then hospice care should be involved and likely discharge to hospice facility 6. Left-sided bacterial pneumonia, suspected aspiration pneumonitis. Continue broad-spectrum coverage with vancomycin and Zosyn for now intravenously 7. Shock, septic as well as posthemorrhagic. Continue IV fluids as well as low-dose of Levophed, wean off as possible   Management plans discussed with the patient, pulmonary intensive care physician, nursing staff.   DRUG ALLERGIES: No Known Allergies  CODE STATUS:     Code Status Orders        Start     Ordered   16-Jul-2015 1855  Do not attempt resuscitation (DNR)   Continuous    Question Answer Comment  In the event of cardiac or respiratory ARREST Do not call a "code blue"   In the event of cardiac or respiratory ARREST Do not perform Intubation, CPR, defibrillation or ACLS   In the event of cardiac or respiratory ARREST Use medication by any route, position, wound care, and other measures to relive pain and suffering. May use oxygen, suction and manual treatment of airway obstruction as needed for comfort.  2015-07-24 1859    Advance Directive Documentation        Most Recent Value   Type of Advance Directive  Healthcare Power of Attorney   Pre-existing out of facility DNR order (yellow form or pink MOST  form)     "MOST" Form in Place?        TOTAL CRITICAL CARE TIME TAKING CARE OF THIS PATIENT: 50 minutes.    Katharina Caper M.D on 07/07/2015 at 10:37 AM  Between 7am to 6pm - Pager - [phone]  After 6pm go to [email or web address] - password [email or web address] [facility]  [facility]  Office  [phone]  CC: Primary care physician; Dorothey Baseman, MD

## 2015-07-07 NOTE — Progress Notes (Signed)
[facility] Encounter Note  Patient: Corey Miller / Admit Date: 07/22/15 / Date of Encounter: 07/07/2015, 7:31 AM   Subjective: Patient unable to converse very well but no apparent significant new cardiovascular symptoms  Review of Systems:  Unable to assess Objective: Telemetry: Normal sinus rhythm with frequent pre-atrial contractions Physical Exam: Blood pressure 103/67, pulse 84, temperature 98.9 F (37.2 C), temperature source Axillary, resp. rate 25, height  (1.702 m), weight 136 lb 0.4 oz (61.7 kg), SpO2 100 %. Body mass index is 21.3 kg/(m^2). General: Well developed, well nourished, in no acute distress. Head: Normocephalic, atraumatic, sclera non-icteric, no xanthomas, nares are without discharge. Neck: No apparent masses Lungs: Normal respirations with few wheezes, no rhonchi, no rales , basilar crackles   Heart: Irregular rate and rhythm, normal S1 S2, 3+ aortic murmur, no rub, no gallop, PMI is normal size and placement, carotid upstroke normal without bruit, jugular venous pressure normal Abdomen: Soft, non-tender, non-distended with normoactive bowel sounds. No hepatosplenomegaly. Abdominal aorta is normal size without bruit Extremities: Trace edema, no clubbing, no cyanosis, no ulcers,  Peripheral: 2+ radial, 2+ femoral, 2+ dorsal pedal pulses Neuro: Alert and oriented. Moves all extremities spontaneously. Psych:  Responds to questions appropriately with a normal affect.   Intake/Output Summary (Last 24 hours) at 07/07/15 0731 Last data filed at 07/07/15 0700  Gross per 24 hour  Intake 3991.45 ml  Output    550 ml  Net 3441.45 ml    Inpatient Medications:  . sodium chloride   Intravenous Once  . antiseptic oral rinse  7 mL Mouth Rinse BID  . atorvastatin  10 mg Oral QHS  . cholecalciferol  400 Units Oral Daily  . docusate sodium  100 mg Oral BID  . donepezil  10 mg Oral QHS  . ferrous sulfate  325 mg Oral Q breakfast  .  fesoterodine  8 mg Oral Daily  . levothyroxine  150 mcg Oral QAC breakfast  . piperacillin-tazobactam (ZOSYN)  IV  3.375 g Intravenous 3 times per day  . risperiDONE  0.5 mg Oral Daily  . risperiDONE  1 mg Oral QHS  . sertraline  50 mg Oral QHS  . sodium chloride  3 mL Intravenous Q12H  . vancomycin  750 mg Intravenous Q24H   Infusions:  . sodium chloride 100 mL/hr at 07-22-15 2300  . amiodarone 30 mg/hr (07-22-15 1848)  . norepinephrine 15 mcg/min (07/07/15 0700)  . pantoprozole (PROTONIX) infusion 8 mg/hr (22-Jul-2015 2300)    Labs:  Recent Labs  07-22-15 0546 07-22-2015 1326 07/07/15 0451  NA 147* 141 147*  K 4.1 3.8 3.6  CL 104 109 115*  CO2 GLUCOSE 109* 82 114*  BUN 25* 22* 19  CREATININE 1.61* 1.33* 1.18  CALCIUM 9.5 8.0* 8.5*  MG 2.2  --   --     Recent Labs  07-22-2015 0546  AST 37  ALT 19  ALKPHOS 92  BILITOT 0.6  PROT 7.5  ALBUMIN 2.5*    Recent Labs  07/22/2015 0546  07-22-15 2158 07/07/15 0451  WBC 15.2*  --   --  15.6*  HGB 10.2*  < > 9.0* 8.9*  HCT 31.2*  < > 28.9* 26.7*  MCV 87.4  --   --  88.4  PLT 450*  --   --  380  < > = values in this interval not displayed.  Recent Labs  July 22, 2015 0546 2015-07-22 1054 2015-07-22 1326 2015/07/22 2158  TROPONINI <  0.03 <0.03 0.03 <0.03   Invalid input(s): POCBNP  Recent Labs  08/01/2015 1054  HGBA1C 4.7     Weights: Filed Weights   07/19/2015 0928 07/14/2015 1300  Weight: 133 lb 3.2 oz (60.419 kg) 136 lb 0.4 oz (61.7 kg)     Radiology/Studies:  Ct Head Wo Contrast  07/23/2015   CLINICAL DATA:  Larey Seat this morning. Top of head laceration. History of vertigo, dementia, hyperlipidemia.  EXAM: CT HEAD WITHOUT CONTRAST  CT CERVICAL SPINE WITHOUT CONTRAST  TECHNIQUE: Multidetector CT imaging of the head and cervical spine was performed following the standard protocol without intravenous contrast. Multiplanar CT image reconstructions of the cervical spine were also generated.  COMPARISON:  None.   FINDINGS: CT HEAD FINDINGS  The ventricles and sulci are normal for age. No intraparenchymal hemorrhage, mass effect nor midline shift. Patchy supratentorial white matter hypodensities are less than expected for patient's age and though non-specific suggest sequelae of chronic small vessel ischemic disease. No acute large vascular territory infarcts.  No abnormal extra-axial fluid collections. Basal cisterns are patent. Mild to moderate calcific atherosclerosis of the carotid siphons.  Moderate RIGHT frontal scalp hematoma without subcutaneous gas or radiopaque foreign bodies. No skull fracture. The included ocular globes and orbital contents are non-suspicious. Status post bilateral ocular lens implants. Mild RIGHT maxillary mucosal thickening without paranasal sinus air-fluid levels. The mastoid air cells are well aerated. Patient is edentulous. 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 Tiny tubular structure in the central clivus suggest nodal cord remanent.  CT CERVICAL SPINE FINDINGS  Cervical vertebral bodies and posterior elements are intact. Maintenance of cervical lordosis. Grade 1 C6-7 anterolisthesis without spondylolysis. LEFT C2-3, RIGHT C3-4 facets are fused on degenerative basis. Multilevel severe facet arthropathy. C1-2 articulation maintained with severe atlantodental osteoarthrosis. No prevertebral soft tissue swelling. Moderate calcific atherosclerosis the RIGHT carotid bulb. Large C4-5 ventral osteophyte deforms the posterior hypopharynx. Irregular scarring of bilateral lung apices.  IMPRESSION: CT HEAD: Moderate RIGHT frontal scalp hematoma.  No skull fracture.  No acute intracranial process, negative CT head for age.  CT CERVICAL SPINE: No acute cervical spine fracture. Grade 1 C6-7 anterolisthesis on degenerative basis.   Electronically Signed   By: Awilda Metro M.D.   On: 08-Jul-2015 06:27   Ct Cervical Spine Wo Contrast  07/06/2015   CLINICAL DATA:  Larey Seat this morning.  Top of head laceration. History of vertigo, dementia, hyperlipidemia.  EXAM: CT HEAD WITHOUT CONTRAST  CT CERVICAL SPINE WITHOUT CONTRAST  TECHNIQUE: Multidetector CT imaging of the head and cervical spine was performed following the standard protocol without intravenous contrast. Multiplanar CT image reconstructions of the cervical spine were also generated.  COMPARISON:  None.  FINDINGS: CT HEAD FINDINGS  The ventricles and sulci are normal for age. No intraparenchymal hemorrhage, mass effect nor midline shift. Patchy supratentorial white matter hypodensities are less than expected for patient's age and though non-specific suggest sequelae of chronic small vessel ischemic disease. No acute large vascular territory infarcts.  No abnormal extra-axial fluid collections. Basal cisterns are patent. Mild to moderate calcific atherosclerosis of the carotid siphons.  Moderate RIGHT frontal scalp hematoma without subcutaneous gas or radiopaque foreign bodies. No skull fracture. The included ocular globes and orbital contents are non-suspicious. Status post bilateral ocular lens implants. Mild RIGHT maxillary mucosal thickening without paranasal sinus air-fluid levels. The mastoid air cells are well aerated. Patient is edentulous. 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 Tiny tubular structure in the central clivus suggest nodal cord remanent.  CT  CERVICAL SPINE FINDINGS  Cervical vertebral bodies and posterior elements are intact. Maintenance of cervical lordosis. Grade 1 C6-7 anterolisthesis without spondylolysis. LEFT C2-3, RIGHT C3-4 facets are fused on degenerative basis. Multilevel severe facet arthropathy. C1-2 articulation maintained with severe atlantodental osteoarthrosis. No prevertebral soft tissue swelling. Moderate calcific atherosclerosis the RIGHT carotid bulb. Large C4-5 ventral osteophyte deforms the posterior hypopharynx. Irregular scarring of bilateral lung apices.  IMPRESSION: CT HEAD:  Moderate RIGHT frontal scalp hematoma.  No skull fracture.  No acute intracranial process, negative CT head for age.  CT CERVICAL SPINE: No acute cervical spine fracture. Grade 1 C6-7 anterolisthesis on degenerative basis.   Electronically Signed   By: Awilda Metro M.D.   On: 07-21-2015 06:27   Dg Op Swallowing Func-medicare/speech Path  06/25/2015   EXAM: MODIFIED BARIUM SWALLOW  TECHNIQUE: Different consistencies of barium were administered orally to the patient by the Speech Pathologist. Imaging of the pharynx was performed in the lateral projection.  FLUOROSCOPY TIME:  Radiation Exposure Index (as provided by the fluoroscopic device): 2.1 mGy  COMPARISON:  None.  FINDINGS: Thin liquid- aspiration.  Nectar thick liquid- prominent aspiration with large bolus. Aspiration of markedly reduced with small bolus.  Pure- moderate retention.  IMPRESSION: Aspiration with thin liquid and nectar. Aspiration is markedly reduced with small bolus. Moderate retention with puree.  Please refer to the Speech Pathologists report for complete details and recommendations.   Electronically Signed   By: Maisie Fus  Register   On: 06/25/2015 13:38   Dg Chest Port 1 View  07/07/2015   CLINICAL DATA:  Acute onset of dyspnea.  Initial encounter.  EXAM: PORTABLE CHEST 1 VIEW  COMPARISON:  Chest radiograph performed 21-Jul-2015  FINDINGS: There is left-sided volume loss and diffuse left-sided airspace opacity, as on the recent prior study. This could reflect mucus plugging, a centrally obstructing mass, or possibly pneumonia and atelectasis. A small left pleural effusion is noted. The right lung appears clear. No pneumothorax is identified.  The cardiomediastinal silhouette is borderline normal in size. No acute osseous abnormalities are seen.  IMPRESSION: Left-sided volume loss and diffuse left-sided airspace opacity again noted. This could reflect mucus plugging, a centrally obstructing mass or possibly pneumonia and atelectasis.  Small left pleural effusion noted.   Electronically Signed   By: Roanna Raider M.D.   On: 07/07/2015 06:58   Dg Chest Port 1 View  07/21/2015   CLINICAL DATA:  Worsening respiratory failure.  EXAM: PORTABLE CHEST 1 VIEW  COMPARISON:  None.  FINDINGS: Diffuse left hemithorax volume loss and airspace opacification with mediastinal shift to the left. The airspace opacity is most pronounced at the left lung base with possible pleural fluid. Clear right lung with mildly prominent interstitial markings. Small linear metallic density overlying the right lateral chest, inferiorly. Mild left shoulder degenerative changes. Thoracic spine degenerative changes.  IMPRESSION: 1. Diffuse left lung airspace opacity and volume loss with a possible left pleural effusion. Differential considerations include mucous plugging, centrally obstructing mass and a combination of pneumonia and atelectasis. 2. Possible left pleural fluid. 3. Mild chronic interstitial lung disease on the right.   Electronically Signed   By: Beckie Salts M.D.   On: 2015/07/21 13:33   Dg Knee Complete 4 Views Right  21-Jul-2015   CLINICAL DATA:  Fall with right knee abrasions.  EXAM: RIGHT KNEE - COMPLETE 4+ VIEW  COMPARISON:  None.  FINDINGS: No fracture, dislocation or suspicious focal osseous lesion. No joint effusion. Moderate tricompartmental osteoarthritis, most prominent in  the medial compartment. Moderate superior right patellar enthesophyte. Vascular calcifications are noted in the posterior soft tissues.  IMPRESSION: 1. No fracture, joint effusion or malalignment in the right knee. 2. Moderate tricompartmental right knee osteoarthritis.   Electronically Signed   By: Delbert Phenix M.D.   On: 08/03/2015 08:21     Assessment and Recommendation  81 y.o. male with essential hypertension mixed hyperlipidemia having a fall with complications of nonvalvular new onset of atrial fibrillation with rapid ventricular rate now spontaneously converted to  normal sinus rhythm with appropriate medication management without evidence of myocardial infarction 1. Continue amiodarone IV at slower rate today and potentially to oral medication at 400 mg each day when patient able to take oral medication 2. No anticoagulation due to concerns of bleeding bruising and anemia as well as fall risk with maintenance of normal sinus rhythm at this time 3. No further cardiac diagnostics necessary at this time due to no evidence of myocardial infarction with normal troponin 4. Begin ambulation and follow for need for adjustments of medications after above  Signed, Arnoldo Hooker M.D. FACC

## 2015-07-08 LAB — H PYLORI, IGM, IGG, IGA AB

## 2015-07-08 MED ORDER — MORPHINE SULFATE (PF) 4 MG/ML IV SOLN: 3.0000 mg | INTRAVENOUS | Status: DC | PRN

## 2015-07-08 MED ORDER — MORPHINE SULFATE (CONCENTRATE) 10 MG/0.5ML PO SOLN
5.0000 mg | ORAL | Status: DC
  Administered 2015-07-08 (×2): 5 mg via ORAL
  Filled 2015-07-08 (×2): qty 1

## 2015-07-17 DIAGNOSIS — K920 Hematemesis: Secondary | ICD-10-CM

## 2015-07-17 DIAGNOSIS — D62 Acute posthemorrhagic anemia: Secondary | ICD-10-CM

## 2015-07-17 DIAGNOSIS — J9601 Acute respiratory failure with hypoxia: Secondary | ICD-10-CM

## 2015-07-17 DIAGNOSIS — J690 Pneumonitis due to inhalation of food and vomit: Secondary | ICD-10-CM

## 2015-08-04 NOTE — Progress Notes (Addendum)
Pt expired at 1934. Nursing supervisor, MD and family notified. Spoke with Jonetta Osgood at [facility] and referral # [phone]. Time of death verified with Paulita Cradle RN.

## 2015-08-04 NOTE — Progress Notes (Signed)
Palliative Medicine Inpatient Consult Follow Up Note   Name: Corey Miller Date: August 04, 2015 MRN: [id number]  DOB: [birth year]  Referring Physician: Katharina Caper, MD  Palliative Care consult requested for this 81 y.o. male for goals of medical therapy in patient with pulmonary edema.  IMPRESSION: 1. Acute respiratory failure due to aspiration pneumonia and pulmonary edema due to AFib --hypoxic resp failure 2. Afib with RVR 3. Hypotensive due to rapid Afib 4. Dysphagia 5. GERD and h/o GI bleed 6. Hypothyroidism   TODAY'S DISCUSSIONS, DECISIONS, AND PLANS 1.  Pt continues as DNR and Terminal Comfort Care only status.  2.  I have spoken with nursing and have adjusted morphine orders since pt was wincing some and also restless. I do not feel he needs a morphine drip and have educated nursing staff that Roxanol is OK to give in the cheek or sublingual even in an unconscious pt since the volume is so low and it is absorbed in the mouth.  Routine doses are not to be held.  3.  I don't believe he will survive long enough to go to [facility] so have not asked for a consult.    4.  I have spoken with his niece.  I asked her to bring the [facility] form up hear next time she visits since it was not obtained on admission.  (NOTE that niece has since dropped of a POA form which helps --but in fact it is not a Environmental consultant and does not have a health care clause contained in the document.  However, pts sister was here and she (being closest 1st degree relative) agrees with current plan of comfort care. )   5.  Daughter did not seem to want oxygen (she had no opinion on this based on what I got from our conversation). So, I am DCing this as it is not so much a comfort issue. What is a comfort issue is enough morphine to reduce the sensation of dyspnea.    REVIEW OF SYSTEMS:  Patient is not able to provide ROS due to end stage of life  CODE STATUS: DNR   PAST MEDICAL HISTORY: Past Medical  History  Diagnosis Date  . Arthritis   . Vertigo   . Thyroid disease   . Hypothyroidism   . Dementia   . Hyperlipidemia     PAST SURGICAL HISTORY:  Past Surgical History  Procedure Laterality Date  . Appendectomy      Vital Signs: BP 86/32 mmHg  Pulse 117  Temp(Src) 98.6 F (37 C) (Oral)  Resp 18  Ht  (1.702 m)  Wt 61.7 kg (136 lb 0.4 oz)  BMI 21.30 kg/m2  SpO2 61% Filed Weights   07/13/2015 0928 07/10/2015 1300  Weight: 60.419 kg (133 lb 3.2 oz) 61.7 kg (136 lb 0.4 oz)    Estimated body mass index is 21.3 kg/(m^2) as calculated from the following:   Height as of this encounter:  (1.702 m).   Weight as of this encounter: 61.7 kg (136 lb 0.4 oz).  PHYSICAL EXAM: Lying in bed --not alert He winces a bit when I examine him --but he does not open his eyes or follow commands EOMI OP clear Neck w/o JVD or TM Hrt irreg irreg Lungs with ronchi and decreased BS Ext cool but not mottling  LABS: CBC:    Component Value Date/Time   WBC 15.6* 07/07/2015 0451   WBC 9.6 08/26/2014 0413   HGB 8.9* 07/07/2015 0451  HGB 11.9* 08/26/2014 0413   HCT 26.7* 07/07/2015 0451   HCT 34.8* 08/26/2014 0413   PLT 380 07/07/2015 0451   PLT 276 08/26/2014 0413   MCV 88.4 07/07/2015 0451   MCV 93 08/26/2014 0413   NEUTROABS 15.6* 05/07/2015 1552   NEUTROABS 6.1 08/26/2014 0413   LYMPHSABS 1.0 05/07/2015 1552   LYMPHSABS 2.2 08/26/2014 0413   MONOABS 2.0* 05/07/2015 1552   MONOABS 0.9 08/26/2014 0413   EOSABS 0.0 05/07/2015 1552   EOSABS 0.3 08/26/2014 0413   BASOSABS 0.0 05/07/2015 1552   BASOSABS 0.0 08/26/2014 0413   Comprehensive Metabolic Panel:    Component Value Date/Time   NA 147* 07/07/2015 0451   NA 142 08/26/2014 0413   K 3.6 07/07/2015 0451   K 3.8 08/26/2014 0413   CL 115* 07/07/2015 0451   CL 107 08/26/2014 0413   CO2 24 07/07/2015 0451   CO2 25 08/26/2014 0413   BUN 19 07/07/2015 0451   BUN 19* 08/26/2014 0413   CREATININE 1.18 07/07/2015 0451    CREATININE 1.08 08/26/2014 0413   GLUCOSE 114* 07/07/2015 0451   GLUCOSE 121* 08/26/2014 0413   CALCIUM 8.5* 07/07/2015 0451   CALCIUM 8.4* 08/26/2014 0413   AST 37 07/21/2015 0546   AST 18 08/26/2014 0413   ALT 19 07/22/2015 0546   ALT 15 08/26/2014 0413   ALKPHOS 92 07/13/2015 0546   ALKPHOS 76 08/26/2014 0413   BILITOT 0.6 07/21/2015 0546   BILITOT 0.4 08/26/2014 0413   PROT 7.5 07/19/2015 0546   PROT 7.2 08/26/2014 0413   ALBUMIN 2.5* 07/16/2015 0546   ALBUMIN 3.0* 08/26/2014 0413    More than 50% of the visit was spent in counseling/coordination of care: YES  Time Spent: 45 min

## 2015-08-04 NOTE — Progress Notes (Signed)
Pt is moving around a little, morphine given x4 for dyspnea, Pt eyes are open and he has attempted to communicate a few times. No other signs of distress noted. Will continue to monitor.

## 2015-08-04 NOTE — Care Management Important Message (Signed)
Important Message  Patient Details  Name: Corey Miller MRN: [id number] Date of Birth: 06/01/1928   Medicare Important Message Given:  Yes-second notification given    Gwenette Greet, RN 07/21/2015, 11:06 AM

## 2015-08-04 NOTE — Discharge Summary (Signed)
[facility] [place] at [facility]   PATIENT NAME: Corey CowboyOllie Miller    MR#:  [id number]  DATE OF BIRTH:  07/01/1928  DATE OF ADMISSION:  07/26/2015 ADMITTING PHYSICIAN: Katharina Caperima Brennen Camper, MD  DATE OF DISCHARGE: 07/13/2015  7:34 PM  PRIMARY CARE PHYSICIAN: Dorothey BasemanAVID BRONSTEIN, MD     ADMISSION DIAGNOSIS:  Dyspnea [R06.00] Acute renal insufficiency [N28.9] Atrial fibrillation with RVR (HCC) [I48.91] Scalp hematoma, initial encounter [S00.03XA] Gastrointestinal hemorrhage associated with gastritis, unspecified gastritis [K29.71]  DISCHARGE DIAGNOSIS:  Principal Problem:   Fall Active Problems:   Hematemesis   Acute respiratory failure with hypoxia (HCC)   Aspiration pneumonitis (HCC)   Atrial fibrillation (HCC)   Acute renal insufficiency   Acute posthemorrhagic anemia   Hypotension   Guaiac positive stools   Leukocytosis   Anemia   SECONDARY DIAGNOSIS:   Past Medical History  Diagnosis Date  . Arthritis   . Vertigo   . Thyroid disease   . Hypothyroidism   . Dementia   . Hyperlipidemia     .pro HOSPITAL COURSE:  patient is a 81 year old Caucasian male with history of dementia, pancreatitis, who presented after a fall and was noted to be hypotensive, also had some bloody discharge around his lips. He was admitted to the hospital and after admission, he had coffee-ground emesis. Chest x-ray revealed diffuse left-sided volume loss, concerning for mucous plugging and aspiration pneumonitis, patient was initiated on IV fluids, antibiotic therapy and BiPAP due to hypoxia. He also required  Levophed for hypotension, due to poor perfusion and poor urinary output.  Patient was seen by gastroenterologist as well as cardiologist who recommended to continue current therapy. . Palliative care consultation was requested to discuss this with family and they made decision to initiate comfort care. Patient was managed conservatively and expired  Discussion by problem 1.  Acute posthemorrhagic anemia, family decided on comfort care measures, supportive therapy . Patient did not require transfusions. Bleeding subsided with medications   2. Gastrointestinal bleed.  Supportive therapy provided, gastroenterology recommended no interventions due to poor medical condition 3. A. fib, RVR, initially managed on Amiodarone intravenously , but patient was very ill and could not take any PO, family made decision about comfort care and patient expired in peace.  4. Acute respiratory failure with hypoxia, initially on BiPAP, oxygen therapy per nasal cannulas , seen by pulmonary/intenisve care and decided against CT scan of chest due to initiation of comfort care measures and poor prognosis overall.  5. Dysphagia with aspirations with any consistencies, palliative care consulted. Patient's family made decision about palliation of care only. There were no hospice beds available in hospice facility, so patient was continued on  conservative therapy in the Hospital and expired in peace 6. Left-sided bacterial pneumonia, suspected aspiration pneumonitis. Initially treated with antibiotics, but condition deteriorated due to persistent aspirations, initiated on comfort care and expired 7. Shock, septic as well as posthemorrhagic. Initially on IV fluids and Levophed , later supportive therapy only  DISCHARGE CONDITIONS:   Patient expired  CONSULTS OBTAINED:  Treatment Team:  Christena DeemMartin U Skulskie, MD Lamar BlinksBruce J Kowalski, MD  DRUG ALLERGIES:  No Known Allergies  DISCHARGE MEDICATIONS:   Discharge Medication List as of 07/22/2015  9:05 PM    CONTINUE these medications which have NOT CHANGED   Details  acetaminophen (TYLENOL) 325 MG tablet Take 650 mg by mouth every 6 (six) hours as needed., Until Discontinued, Historical Med    atorvastatin (LIPITOR) 10 MG tablet Take 10 mg  by mouth at bedtime., Until Discontinued, Historical Med    cholecalciferol (VITAMIN D) 400 UNITS TABS tablet  Take 400 Units by mouth daily., Until Discontinued, Historical Med    donepezil (ARICEPT) 10 MG tablet Take 10 mg by mouth at bedtime., Until Discontinued, Historical Med    ferrous sulfate 325 (65 FE) MG tablet Take 325 mg by mouth daily with breakfast., Until Discontinued, Historical Med    fesoterodine (TOVIAZ) 8 MG TB24 tablet Take 8 mg by mouth daily., Until Discontinued, Historical Med    ipratropium-albuterol (DUONEB) 0.5-2.5 (3) MG/3ML SOLN Take 3 mLs by nebulization every 6 (six) hours as needed., Until Discontinued, Historical Med    levothyroxine (SYNTHROID, LEVOTHROID) 150 MCG tablet Take 150 mcg by mouth daily before breakfast., Until Discontinued, Historical Med    LORazepam (ATIVAN) 0.5 MG tablet Take 0.25 mg by mouth daily., Until Discontinued, Historical Med    Melatonin 5 MG TABS Take 1 tablet by mouth at bedtime., Until Discontinued, Historical Med    ondansetron (ZOFRAN) 4 MG tablet Take 4 mg by mouth every 6 (six) hours as needed for nausea or vomiting., Until Discontinued, Historical Med    ranitidine (ZANTAC) 150 MG tablet Take 150 mg by mouth 2 (two) times daily., Until Discontinued, Historical Med    risperiDONE (RISPERDAL) 0.5 MG tablet Take 0.5-1 mg by mouth 2 (two) times daily. Pt takes one tablet in the morning and two tablets at bedtime., Until Discontinued, Historical Med    sertraline (ZOLOFT) 50 MG tablet Take 50 mg by mouth at bedtime., Until Discontinued, Historical Med    sulfamethoxazole-trimethoprim (BACTRIM DS,SEPTRA DS) 800-160 MG tablet Take 1 tablet by mouth 2 (two) times daily., Starting 06/23/2015, Until Fri 07/10/15, Historical Med         DISCHARGE INSTRUCTIONS:      If you experience worsening of your admission symptoms, develop shortness of breath, life threatening emergency, suicidal or homicidal thoughts you must seek medical attention immediately by calling 911 or calling your MD immediately  if symptoms less severe.  You Must read  complete instructions/literature along with all the possible adverse reactions/side effects for all the Medicines you take and that have been prescribed to you. Take any new Medicines after you have completely understood and accept all the possible adverse reactions/side effects.   Please note  You were cared for by a hospitalist during your hospital stay. If you have any questions about your discharge medications or the care you received while you were in the hospital after you are discharged, you can call the unit and asked to speak with the hospitalist on call if the hospitalist that took care of you is not available. Once you are discharged, your primary care physician will handle any further medical issues. Please note that NO REFILLS for any discharge medications will be authorized once you are discharged, as it is imperative that you return to your primary care physician (or establish a relationship with a primary care physician if you do not have one) for your aftercare needs so that they can reassess your need for medications and monitor your lab values.    Today   CHIEF COMPLAINT:   Chief Complaint  Patient presents with  . Fall    87 yom PMHx dementia, DNR, pancreatitis presents via EMS from [facility] after staff found pt on floor with right forehead contusion, right elbow and right lower extremity abrasions.     HISTORY OF PRESENT ILLNESS:  Corey Miller  is a  81 y.o. male with a known history of dementia, dysphagia, pancreatitis, who presented after a fall and was noted to be hypotensive, also had some bloody discharge around his lips. He was admitted to the hospital and after admission, he had coffee-ground emesis. Chest x-ray revealed diffuse left-sided volume loss, concerning for mucous plugging and aspiration pneumonitis, patient was initiated on IV fluids, antibiotic therapy and BiPAP due to hypoxia. He also required  Levophed for hypotension, due to poor perfusion and  poor urinary output.  Patient was seen by gastroenterologist as well as cardiologist who recommended to continue current therapy. . Palliative care consultation was requested to discuss this with family and they made decision to initiate comfort care. Patient was managed conservatively and expired  Discussion by problem 1. Acute posthemorrhagic anemia, family decided on comfort care measures, supportive therapy . Patient did not require transfusions. Bleeding subsided with medications   2. Gastrointestinal bleed.  Supportive therapy provided, gastroenterology recommended no interventions due to poor medical condition 3. A. fib, RVR, initially managed on Amiodarone intravenously , but patient was very ill and could not take any PO, family made decision about comfort care and patient expired in peace.  4. Acute respiratory failure with hypoxia, initially on BiPAP, oxygen therapy per nasal cannulas , seen by pulmonary/intenisve care and decided against CT scan of chest due to initiation of comfort care measures and poor prognosis overall.  5. Dysphagia with aspirations with any consistencies, palliative care consulted. Patient's family made decision about palliation of care only. There were no hospice beds available in hospice facility, so patient was continued on  conservative therapy in the Hospital and expired in peace 6. Left-sided bacterial pneumonia, suspected aspiration pneumonitis. Initially treated with antibiotics, but condition deteriorated due to persistent aspirations, initiated on comfort care and expired 7. Shock, septic as well as posthemorrhagic. Initially on IV fluids and Levophed , later supportive therapy only     VITAL SIGNS:  Blood pressure 86/32, pulse 117, temperature 98.6 F (37 C), temperature source Oral, resp. rate 18, height 5\' 7"  (1.702 m), weight 61.689 kg (136 lb), SpO2 61 %.  I/O:  No intake or output data in the 24 hours ending 07/17/15 1048  PHYSICAL EXAMINATION:   GENERAL:  81 y.o.-year-old patient lying in the bed with no acute distress.  EYES: Pupils equal, round, reactive to light and accommodation. No scleral icterus. Extraocular muscles intact.  HEENT: Head atraumatic, normocephalic. Oropharynx and nasopharynx clear.  NECK:  Supple, no jugular venous distention. No thyroid enlargement, no tenderness.  LUNGS: Normal breath sounds bilaterally, no wheezing, rales,rhonchi or crepitation. No use of accessory muscles of respiration.  CARDIOVASCULAR: S1, S2 normal. No murmurs, rubs, or gallops.  ABDOMEN: Soft, non-tender, non-distended. Bowel sounds present. No organomegaly or mass.  EXTREMITIES: No pedal edema, cyanosis, or clubbing.  NEUROLOGIC: Cranial nerves II through XII are intact. Muscle strength 5/5 in all extremities. Sensation intact. Gait not checked.  PSYCHIATRIC: The patient is alert and oriented x 3.  SKIN: No obvious rash, lesion, or ulcer.   DATA REVIEW:   CBC No results for input(s): WBC, HGB, HCT, PLT in the last 168 hours.  Chemistries  No results for input(s): NA, K, CL, CO2, GLUCOSE, BUN, CREATININE, CALCIUM, MG, AST, ALT, ALKPHOS, BILITOT in the last 168 hours.  Invalid input(s): GFRCGP  Cardiac Enzymes No results for input(s): TROPONINI in the last 168 hours.  Microbiology Results  Results for orders placed or performed during the hospital encounter of 07/04/2015  MRSA  PCR Screening     Status: None   Collection Time: 07/16/2015 12:32 PM  Result Value Ref Range Status   MRSA by PCR NEGATIVE NEGATIVE Final    Comment:        The GeneXpert MRSA Assay (FDA approved for NASAL specimens only), is one component of a comprehensive MRSA colonization surveillance program. It is not intended to diagnose MRSA infection nor to guide or monitor treatment for MRSA infections.     RADIOLOGY:  No results found.  EKG:   Orders placed or performed during the hospital encounter of 07/10/2015  . ED EKG  . ED EKG  . EKG       Management plans discussed with the patient, family and they are in agreement.  CODE STATUS:  Advance Directive Documentation        Most Recent Value   Type of Advance Directive  Healthcare Power of Attorney   Pre-existing out of facility DNR order (yellow form or pink MOST form)     "MOST" Form in Place?        TOTAL TIME TAKING CARE OF THIS PATIENT: 40 minutes.    Katharina Caper M.D on 07/17/2015 at 10:48 AM  Between 7am to 6pm - Pager - [phone]  After 6pm go to [email or web address] - password [email or web address] [facility]  [place] [facility]  Office  [phone]  CC: Primary care physician; Dorothey Baseman, MD

## 2015-08-04 NOTE — Progress Notes (Signed)
[facility] - [place] at [facility]   PATIENT NAME: Corey Miller    MR#:  [id number]  DATE OF BIRTH:  [birth year]  SUBJECTIVE:  CHIEF COMPLAINT:   Chief Complaint  Patient presents with  . Fall    81 yom PMHx dementia, DNR, pancreatitis presents via EMS from [facility] facility after staff found pt on floor with right forehead contusion, right elbow and right lower extremity abrasions.    patient is a 81 year old Caucasian male with history of dementia, pancreatitis, who presented after a fall and was noted to be hypotensive, also had some bloody discharge around his lips. He was admitted to the hospital and after admission, he had coffee-ground emesis. Chest x-ray revealed diffuse left-sided volume loss, concerning for mucous plugging, patient was initiated on IV fluids, antibiotic therapy and BiPAP due to hypoxia. He also required little amount of Levophed for hypotension, due to poor perfusion and poor urinary output. Today, remains on the 70- 90% of BiPAP, denies any chest pains, nausea, vomiting, abdominal discomfort. He had a bowel movement which was brown and no coffee-ground emesis was noted. He remains on Protonix IV drip. On arrival to the hospital. He was also noted to be in A. fib, RVR requiring amiodarone IV drip, she remains on amiodarone at present. Patient was seen by gastroenterologist as well as cardiologist who recommended to continue current therapy. Patient's hemoglobin level is relatively stable and 8.9 today in the morning.  Discussed with speech Therapist yesterday about patient's severe dysphagia to any consistencies of food, recommended to consider alternative means of nutrition including possibly PEG tube placement. Palliative care consultation was requested to discuss this with family, now patient is on comfort care. Not able to provide review of systems  Review of Systems  Unable to perform ROS: medical condition    VITAL SIGNS: Blood pressure  86/32, pulse 117, temperature 98.6 F (37 C), temperature source Oral, resp. rate 18, height 5\' 7"  (1.702 m), weight 61.7 kg (136 lb 0.4 oz), SpO2 61 %.  PHYSICAL EXAMINATION:   GENERAL:  81 y.o.-year-old patient lying in the bed in moderate to severe respiratory distress on room air , very dyspneic and thrashing in the bed, trying to climb out. Rhonchorous breathing from the distance EYES: Pupils equal, round, reactive to light and accommodation. No scleral icterus. Extraocular muscles intact. A dental is HEENT: Head atraumatic, normocephalic. Oropharynx and nasopharynx clear, some dry blood was noted around his lips.  NECK:  Supple, no jugular venous distention. No thyroid enlargement, no tenderness.  LUNGS: Improved breath sounds on the left. Overall, diminished air entrance on the right, no wheezing, but bilateral rales,rhonchi , no crepitation. Using accessory muscles of respiration, very short of breath, dyspneic, rhonchorous breathing heard from the distance CARDIOVASCULAR: S1, S2 , irregularly irregular, tachycardic. No murmurs, rubs, or gallops.  ABDOMEN: Soft, nontender, nondistended. Bowel sounds present. No organomegaly or mass.  EXTREMITIES: No pedal edema, cyanosis, or clubbing.  NEUROLOGIC: Cranial nerves II through XII are intact. Muscle strength difficult to evaluate how the patient is able to move all all extremities. Sensation not able to assess. Gait not checked. Nonverbal, pointing with the right hand, very stressed out and uncomfortable PSYCHIATRIC: The patient is alert and not oriented. Able to follow some commands and but not able to answer  questions SKIN: No obvious rash, lesion, or ulcer.   ORDERS/RESULTS REVIEWED:   CBC  Recent Labs Lab 07/24/2015 0546 07/11/2015 1326 08/02/2015 1932 07/23/2015 2158 07/07/15 0451  WBC 15.2*  --   --   --  15.6*  HGB 10.2* 8.7* 8.8* 9.0* 8.9*  HCT 31.2* 27.7* 27.4* 28.9* 26.7*  PLT 450*  --   --   --  380  MCV 87.4  --   --   --   88.4  MCH 28.5  --   --   --  29.4  MCHC 32.6  --   --   --  33.3  RDW 14.8*  --   --   --  15.3*   ------------------------------------------------------------------------------------------------------------------  Chemistries   Recent Labs Lab 2015-07-18 0546 July 18, 2015 1326 07/07/15 0451  NA 147* 141 147*  K 4.1 3.8 3.6  CL 104 109 115*  CO2 GLUCOSE 109* 82 114*  BUN 25* 22* 19  CREATININE 1.61* 1.33* 1.18  CALCIUM 9.5 8.0* 8.5*  MG 2.2  --   --   AST 37  --   --   ALT 19  --   --   ALKPHOS 92  --   --   BILITOT 0.6  --   --    ------------------------------------------------------------------------------------------------------------------ estimated creatinine clearance is 39.9 mL/min (by C-G formula based on Cr of 1.18). ------------------------------------------------------------------------------------------------------------------  Recent Labs  07/18/2015 1054  TSH 0.645    Cardiac Enzymes  Recent Labs Lab 07/18/2015 1054 07/18/2015 1326 2015-07-18 2158  TROPONINI <0.03 0.03 <0.03   ------------------------------------------------------------------------------------------------------------------ Invalid input(s): POCBNP ---------------------------------------------------------------------------------------------------------------  RADIOLOGY: Dg Chest Port 1 View  07/07/2015   CLINICAL DATA:  Acute onset of dyspnea.  Initial encounter.  EXAM: PORTABLE CHEST 1 VIEW  COMPARISON:  Chest radiograph performed 2015/07/18  FINDINGS: There is left-sided volume loss and diffuse left-sided airspace opacity, as on the recent prior study. This could reflect mucus plugging, a centrally obstructing mass, or possibly pneumonia and atelectasis. A small left pleural effusion is noted. The right lung appears clear. No pneumothorax is identified.  The cardiomediastinal silhouette is borderline normal in size. No acute osseous abnormalities are seen.  IMPRESSION: Left-sided  volume loss and diffuse left-sided airspace opacity again noted. This could reflect mucus plugging, a centrally obstructing mass or possibly pneumonia and atelectasis. Small left pleural effusion noted.   Electronically Signed   By: Roanna Raider M.D.   On: 07/07/2015 06:58   Dg Chest Port 1 View  2015-07-18   CLINICAL DATA:  Worsening respiratory failure.  EXAM: PORTABLE CHEST 1 VIEW  COMPARISON:  None.  FINDINGS: Diffuse left hemithorax volume loss and airspace opacification with mediastinal shift to the left. The airspace opacity is most pronounced at the left lung base with possible pleural fluid. Clear right lung with mildly prominent interstitial markings. Small linear metallic density overlying the right lateral chest, inferiorly. Mild left shoulder degenerative changes. Thoracic spine degenerative changes.  IMPRESSION: 1. Diffuse left lung airspace opacity and volume loss with a possible left pleural effusion. Differential considerations include mucous plugging, centrally obstructing mass and a combination of pneumonia and atelectasis. 2. Possible left pleural fluid. 3. Mild chronic interstitial lung disease on the right.   Electronically Signed   By: Beckie Salts M.D.   On: 07/18/15 13:33    EKG:  Orders placed or performed during the hospital encounter of 07/18/2015  . ED EKG  . ED EKG    ASSESSMENT AND PLAN:  Principal Problem:   Fall Active Problems:   Atrial fibrillation (HCC)   Hypotension   Acute renal insufficiency   Guaiac positive stools   Leukocytosis   Anemia  1. Acute posthemorrhagic anemia, she has family decided on comfort care measures, supportive therapy only at this point. No active bleeding was noted recently.  2. Gastrointestinal bleed. Of Protonix due to initiation of comfort measures. Supportive therapy only, no planned interventions at present 3. A. fib, RVR, cough Amiodarone intravenously , heart rate is elevated due to inability to take oral medications. Not  on any anticoagulation due to risks of bleeding and recent active bleeding 4. Acute respiratory failure with hypoxia, off BiPAP, initiate oxygen therapy per nasal cannulas or Ventimask to keep patient comfortable. No CT scan of chest was performed yesterday due to initiation of comfort care measures and poor prognosis overall.  5. Dysphagia with aspirations with any consistencies, palliative care . His closest family and patient's family made decision about palliation of care only. However, no hospice beds available in hospice facility, patient is to continue current therapy in the  Hospital 6. Left-sided bacterial pneumonia, suspected aspiration pneumonitis. Of antibiotics  7. Shock, septic as well as posthemorrhagic. Off IV fluids or Levophed patient's blood pressure is low. Supportive therapy only    Management plans discussed with the patient, pulmonary intensive care physician, nursing staff.   DRUG ALLERGIES: No Known Allergies  CODE STATUS:     Code Status Orders        Start     Ordered   07-22-15 1855  Do not attempt resuscitation (DNR)   Continuous    Question Answer Comment  In the event of cardiac or respiratory ARREST Do not call a "code blue"   In the event of cardiac or respiratory ARREST Do not perform Intubation, CPR, defibrillation or ACLS   In the event of cardiac or respiratory ARREST Use medication by any route, position, wound care, and other measures to relive pain and suffering. May use oxygen, suction and manual treatment of airway obstruction as needed for comfort.      2015-07-22 1859    Advance Directive Documentation        Most Recent Value   Type of Advance Directive  Healthcare Power of Attorney   Pre-existing out of facility DNR order (yellow form or pink MOST form)     "MOST" Form in Place?        TOTAL CRITICAL CARE TIME TAKING CARE OF THIS PATIENT: .  Patient's care,  management plan, discharge planning was discussed with nursing staff  as well as social work  Sabri Teal M.D on 07/28/2015 at 12:34 PM  Between 7am to 6pm - Pager - [phone]  After 6pm go to [email or web address] - password [email or web address]  [place] [facility]  Office  [phone]  CC: Primary care physician; Dorothey Baseman, MD

## 2015-08-04 NOTE — Progress Notes (Signed)
Patient with no pulse, no respirations, lack of pupillary response. Patient is DNR. Patient is Comfort Care. Order for [employer]. Pronounced by K. Jonluke Cobbins, RN and Emilia Beck, Charity fundraiser. No Family at Bedside. Christinia Gully, Niece notified of time of death. No questions left unanswered. MD notified. Nursing Supervisor notified. COPA notified by RN.

## 2015-08-04 NOTE — Care Management (Signed)
Admitted to [facility] with the diagnosis of falls. A resident of [facility] since 05/10/15. Sees Dr. Terance Hart at [facility]. Sister is Donnald Garre [phone]). Niece is Suzanne Boron [phone]). NPO. Morphine for discomfort. WBC's 15.6 Comfort Care measures. Gwenette Greet RN MSn Care Management [phone]

## 2015-08-04 NOTE — Progress Notes (Signed)
Notified by nursing staff that patient had passed away at 19:34.  Pt. Was under comfort care only.

## 2015-08-04 NOTE — Progress Notes (Signed)
[facility] Encounter Note  Patient: Corey Miller / Admit Date: 07/05/2015 / Date of Encounter: 07-13-15, 8:25 AM   Subjective: Patient unable to converse very well but no apparent significant new cardiovascular symptoms Heart rate much faster today due to unable to take oral medications for control of heart rate No apparent heart failure at this time  Review of Systems:  Unable to assess Objective: Telemetry: Normal sinus rhythm with frequent pre-atrial contractions Physical Exam: Blood pressure 86/32, pulse 117, temperature 98.6 F (37 C), temperature source Oral, resp. rate 18, height  (1.702 m), weight 136 lb 0.4 oz (61.7 kg), SpO2 61 %. Body mass index is 21.3 kg/(m^2). General: Well developed, disheveled, in no acute distress. Head: Normocephalic, atraumatic, sclera non-icteric, no xanthomas, nares are without discharge. Neck: No apparent masses Lungs: Normal respirations with few wheezes, no rhonchi, no rales , basilar crackles   Heart: Irregular rate and rhythm, normal S1 S2, 3+ aortic murmur, no rub, no gallop, PMI is normal size and placement, carotid upstroke normal without bruit, jugular venous pressure normal Abdomen: Soft, non-tender, non-distended with normoactive bowel sounds. No hepatosplenomegaly. Abdominal aorta is normal size without bruit Extremities: Trace edema, no clubbing, no cyanosis, no ulcers,  Peripheral: 2+ radial, 2+ femoral, 2+ dorsal pedal pulses Neuro: Not Alert and oriented. Moves all extremities spontaneously.    Intake/Output Summary (Last 24 hours) at 07/13/2015 0825 Last data filed at 07/07/15 1400  Gross per 24 hour  Intake 504.87 ml  Output    151 ml  Net 353.87 ml    Inpatient Medications:  . morphine CONCENTRATE  5 mg Oral Q6H   Infusions:     Labs:  Recent Labs  07/09/2015 0546 07/09/2015 1326 07/07/15 0451  NA 147* 141 147*  K 4.1 3.8 3.6  CL 104 109 115*  CO2 GLUCOSE 109* 82 114*   BUN 25* 22* 19  CREATININE 1.61* 1.33* 1.18  CALCIUM 9.5 8.0* 8.5*  MG 2.2  --   --     Recent Labs  07/29/2015 0546  AST 37  ALT 19  ALKPHOS 92  BILITOT 0.6  PROT 7.5  ALBUMIN 2.5*    Recent Labs  07/21/2015 0546  07/19/2015 2158 07/07/15 0451  WBC 15.2*  --   --  15.6*  HGB 10.2*  < > 9.0* 8.9*  HCT 31.2*  < > 28.9* 26.7*  MCV 87.4  --   --  88.4  PLT 450*  --   --  380  < > = values in this interval not displayed.  Recent Labs  08/03/2015 0546 07/09/2015 1054 07/09/2015 1326 07/16/2015 2158  TROPONINI <0.03 <0.03 0.03 <0.03   Invalid input(s): POCBNP  Recent Labs  07/05/2015 1054  HGBA1C 4.7     Weights: Filed Weights   07/22/2015 0928 07/16/2015 1300  Weight: 133 lb 3.2 oz (60.419 kg) 136 lb 0.4 oz (61.7 kg)     Radiology/Studies:  Ct Head Wo Contrast  07/25/2015   CLINICAL DATA:  Larey Seat this morning. Top of head laceration. History of vertigo, dementia, hyperlipidemia.  EXAM: CT HEAD WITHOUT CONTRAST  CT CERVICAL SPINE WITHOUT CONTRAST  TECHNIQUE: Multidetector CT imaging of the head and cervical spine was performed following the standard protocol without intravenous contrast. Multiplanar CT image reconstructions of the cervical spine were also generated.  COMPARISON:  None.  FINDINGS: CT HEAD FINDINGS  The ventricles and sulci are normal for age. No intraparenchymal hemorrhage, mass effect nor  midline shift. Patchy supratentorial white matter hypodensities are less than expected for patient's age and though non-specific suggest sequelae of chronic small vessel ischemic disease. No acute large vascular territory infarcts.  No abnormal extra-axial fluid collections. Basal cisterns are patent. Mild to moderate calcific atherosclerosis of the carotid siphons.  Moderate RIGHT frontal scalp hematoma without subcutaneous gas or radiopaque foreign bodies. No skull fracture. The included ocular globes and orbital contents are non-suspicious. Status post bilateral ocular lens implants.  Mild RIGHT maxillary mucosal thickening without paranasal sinus air-fluid levels. The mastoid air cells are well aerated. Patient is edentulous. 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 Tiny tubular structure in the central clivus suggest nodal cord remanent.  CT CERVICAL SPINE FINDINGS  Cervical vertebral bodies and posterior elements are intact. Maintenance of cervical lordosis. Grade 1 C6-7 anterolisthesis without spondylolysis. LEFT C2-3, RIGHT C3-4 facets are fused on degenerative basis. Multilevel severe facet arthropathy. C1-2 articulation maintained with severe atlantodental osteoarthrosis. No prevertebral soft tissue swelling. Moderate calcific atherosclerosis the RIGHT carotid bulb. Large C4-5 ventral osteophyte deforms the posterior hypopharynx. Irregular scarring of bilateral lung apices.  IMPRESSION: CT HEAD: Moderate RIGHT frontal scalp hematoma.  No skull fracture.  No acute intracranial process, negative CT head for age.  CT CERVICAL SPINE: No acute cervical spine fracture. Grade 1 C6-7 anterolisthesis on degenerative basis.   Electronically Signed   By: Awilda Metro M.D.   On: 2015-07-14 06:27   Ct Cervical Spine Wo Contrast  07-14-2015   CLINICAL DATA:  Larey Seat this morning. Top of head laceration. History of vertigo, dementia, hyperlipidemia.  EXAM: CT HEAD WITHOUT CONTRAST  CT CERVICAL SPINE WITHOUT CONTRAST  TECHNIQUE: Multidetector CT imaging of the head and cervical spine was performed following the standard protocol without intravenous contrast. Multiplanar CT image reconstructions of the cervical spine were also generated.  COMPARISON:  None.  FINDINGS: CT HEAD FINDINGS  The ventricles and sulci are normal for age. No intraparenchymal hemorrhage, mass effect nor midline shift. Patchy supratentorial white matter hypodensities are less than expected for patient's age and though non-specific suggest sequelae of chronic small vessel ischemic disease. No acute large  vascular territory infarcts.  No abnormal extra-axial fluid collections. Basal cisterns are patent. Mild to moderate calcific atherosclerosis of the carotid siphons.  Moderate RIGHT frontal scalp hematoma without subcutaneous gas or radiopaque foreign bodies. No skull fracture. The included ocular globes and orbital contents are non-suspicious. Status post bilateral ocular lens implants. Mild RIGHT maxillary mucosal thickening without paranasal sinus air-fluid levels. The mastoid air cells are well aerated. Patient is edentulous. 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 Tiny tubular structure in the central clivus suggest nodal cord remanent.  CT CERVICAL SPINE FINDINGS  Cervical vertebral bodies and posterior elements are intact. Maintenance of cervical lordosis. Grade 1 C6-7 anterolisthesis without spondylolysis. LEFT C2-3, RIGHT C3-4 facets are fused on degenerative basis. Multilevel severe facet arthropathy. C1-2 articulation maintained with severe atlantodental osteoarthrosis. No prevertebral soft tissue swelling. Moderate calcific atherosclerosis the RIGHT carotid bulb. Large C4-5 ventral osteophyte deforms the posterior hypopharynx. Irregular scarring of bilateral lung apices.  IMPRESSION: CT HEAD: Moderate RIGHT frontal scalp hematoma.  No skull fracture.  No acute intracranial process, negative CT head for age.  CT CERVICAL SPINE: No acute cervical spine fracture. Grade 1 C6-7 anterolisthesis on degenerative basis.   Electronically Signed   By: Awilda Metro M.D.   On: July 14, 2015 06:27   Dg Op Swallowing Func-medicare/speech Path  06/25/2015   EXAM: MODIFIED BARIUM SWALLOW  TECHNIQUE:  Different consistencies of barium were administered orally to the patient by the Speech Pathologist. Imaging of the pharynx was performed in the lateral projection.  FLUOROSCOPY TIME:  Radiation Exposure Index (as provided by the fluoroscopic device): 2.1 mGy  COMPARISON:  None.  FINDINGS: Thin liquid-  aspiration.  Nectar thick liquid- prominent aspiration with large bolus. Aspiration of markedly reduced with small bolus.  Pure- moderate retention.  IMPRESSION: Aspiration with thin liquid and nectar. Aspiration is markedly reduced with small bolus. Moderate retention with puree.  Please refer to the Speech Pathologists report for complete details and recommendations.   Electronically Signed   By: Maisie Fus  Register   On: 06/25/2015 13:38   Dg Chest Port 1 View  07/07/2015   CLINICAL DATA:  Acute onset of dyspnea.  Initial encounter.  EXAM: PORTABLE CHEST 1 VIEW  COMPARISON:  Chest radiograph performed 08-01-15  FINDINGS: There is left-sided volume loss and diffuse left-sided airspace opacity, as on the recent prior study. This could reflect mucus plugging, a centrally obstructing mass, or possibly pneumonia and atelectasis. A small left pleural effusion is noted. The right lung appears clear. No pneumothorax is identified.  The cardiomediastinal silhouette is borderline normal in size. No acute osseous abnormalities are seen.  IMPRESSION: Left-sided volume loss and diffuse left-sided airspace opacity again noted. This could reflect mucus plugging, a centrally obstructing mass or possibly pneumonia and atelectasis. Small left pleural effusion noted.   Electronically Signed   By: Roanna Raider M.D.   On: 07/07/2015 06:58   Dg Chest Port 1 View  Aug 01, 2015   CLINICAL DATA:  Worsening respiratory failure.  EXAM: PORTABLE CHEST 1 VIEW  COMPARISON:  None.  FINDINGS: Diffuse left hemithorax volume loss and airspace opacification with mediastinal shift to the left. The airspace opacity is most pronounced at the left lung base with possible pleural fluid. Clear right lung with mildly prominent interstitial markings. Small linear metallic density overlying the right lateral chest, inferiorly. Mild left shoulder degenerative changes. Thoracic spine degenerative changes.  IMPRESSION: 1. Diffuse left lung airspace  opacity and volume loss with a possible left pleural effusion. Differential considerations include mucous plugging, centrally obstructing mass and a combination of pneumonia and atelectasis. 2. Possible left pleural fluid. 3. Mild chronic interstitial lung disease on the right.   Electronically Signed   By: Beckie Salts M.D.   On: 08-01-15 13:33   Dg Knee Complete 4 Views Right  2015/08/01   CLINICAL DATA:  Fall with right knee abrasions.  EXAM: RIGHT KNEE - COMPLETE 4+ VIEW  COMPARISON:  None.  FINDINGS: No fracture, dislocation or suspicious focal osseous lesion. No joint effusion. Moderate tricompartmental osteoarthritis, most prominent in the medial compartment. Moderate superior right patellar enthesophyte. Vascular calcifications are noted in the posterior soft tissues.  IMPRESSION: 1. No fracture, joint effusion or malalignment in the right knee. 2. Moderate tricompartmental right knee osteoarthritis.   Electronically Signed   By: Delbert Phenix M.D.   On: 08-01-2015 08:21     Assessment and Recommendation  81 y.o. male with essential hypertension mixed hyperlipidemia having a fall with complications of nonvalvular new onset of atrial fibrillation with rapid ventricular rate converted to normal sinus rhythm and then unable to take oral medications and now back in atrial fibrillation with rapid ventricular rate but tolerating well from the cardiac standpoint without evidence of myocardial infarction  1. Comfort care due to inability to take oral medications and treatment of atrial fibrillation 2. No anticoagulation due to concerns of  bleeding bruising and anemia as well as fall risk with maintenance of normal sinus rhythm at this time 3. No further cardiac diagnostics necessary at this time due to no evidence of myocardial infarction with normal troponin 4. Call if further questions  Signed, Arnoldo Hooker M.D. FACC

## 2015-08-04 DEATH — deceased

## 2015-11-06 IMAGING — CT CT CERVICAL SPINE W/O CM
2 of 5 series · 4 of 14 positions shown, 5 images · non-contrast
Comparison: None.

CLINICAL DATA: Fell this morning. Top of head laceration. History
of vertigo, dementia, hyperlipidemia.

EXAM:
CT HEAD WITHOUT CONTRAST
CT CERVICAL SPINE WITHOUT CONTRAST
TECHNIQUE: Multidetector CT imaging of the head and cervical spine was
performed following the standard protocol without intravenous
contrast. Multiplanar CT image reconstructions of the cervical spine
were also generated.

[Series 3: bone · axial · 0.44mm/px · z∈[-141,-83]mm · 2 of 87 slices shown]
[im 29/87  bone]
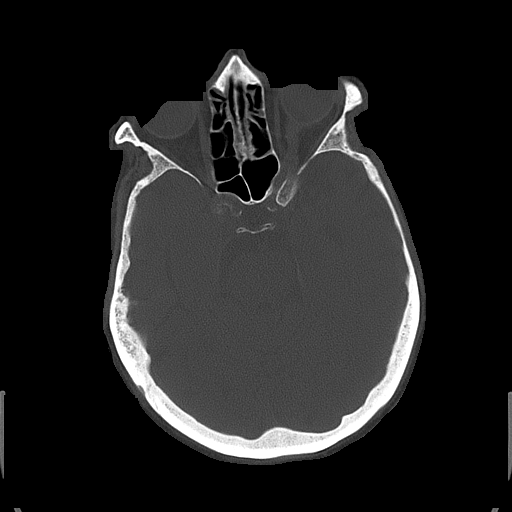
[im 58/87  bone]
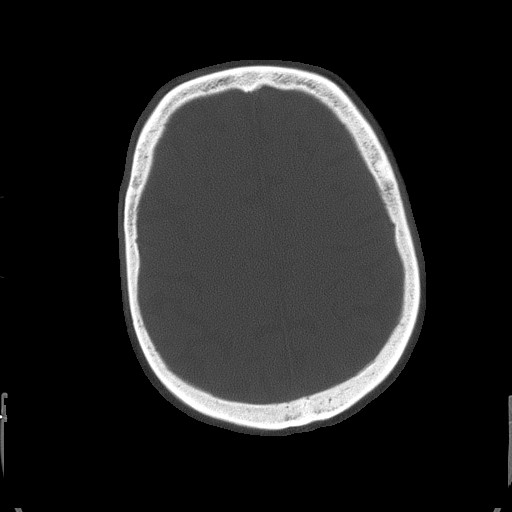

[Series 12: orthogonal axials · axial · 0.25mm/px · z∈[-322,-266]mm · 2 of 101 slices shown, 3 images]
[im 34/101  soft-tissue]
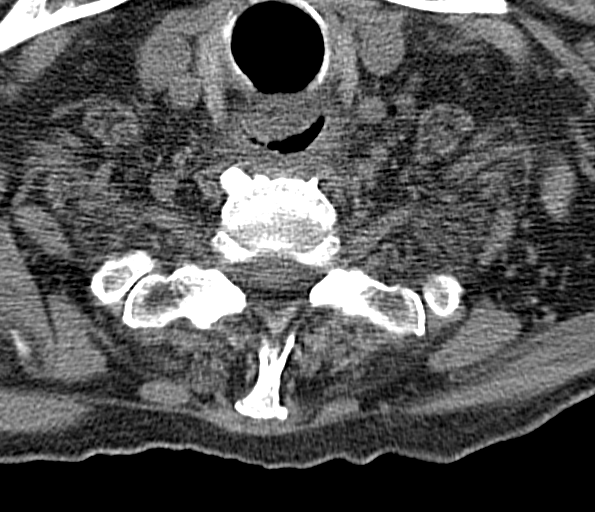
[im 34/101  bone]
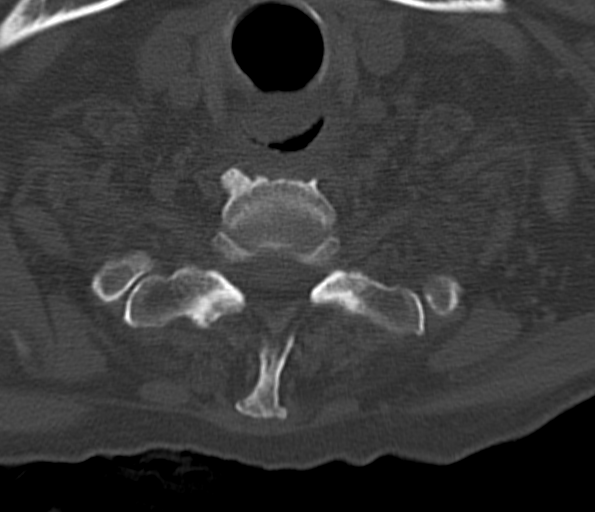
[im 67/101  bone]
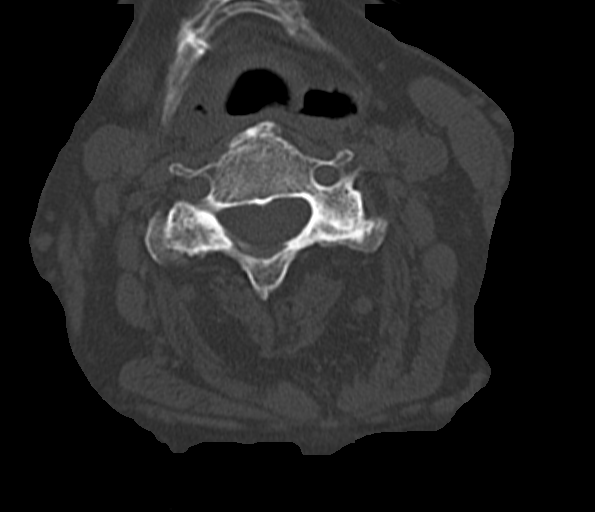

[4 of 14 positions shown; findings below may reference images not displayed]

FINDINGS: CT HEAD FINDINGS

The ventricles and sulci are normal for age. No intraparenchymal
hemorrhage, mass effect nor midline shift. Patchy supratentorial
white matter hypodensities are less than expected for patient's age
and though non-specific suggest sequelae of chronic small vessel
ischemic disease. No acute large vascular territory infarcts.

No abnormal extra-axial fluid collections. Basal cisterns are
patent. Mild to moderate calcific atherosclerosis of the carotid
siphons.

Moderate RIGHT frontal scalp hematoma without subcutaneous gas or
radiopaque foreign bodies. No skull fracture. The included ocular
globes and orbital contents are non-suspicious. Status post
bilateral ocular lens implants. Mild RIGHT maxillary mucosal
thickening without paranasal sinus air-fluid levels. The mastoid air
cells are well aerated. Patient is edentulous. Moderate to severe
RIGHT temporomandibular osteoarthrosis. Tiny tubular structure in
the central clivus suggest nodal cord remanent.

CT CERVICAL SPINE FINDINGS

Cervical vertebral bodies and posterior elements are intact.
Maintenance of cervical lordosis. Grade 1 C6-7 anterolisthesis
without spondylolysis. LEFT C2-3, RIGHT C3-4 facets are fused on
degenerative basis. Multilevel severe facet arthropathy. C1-2
articulation maintained with severe atlantodental osteoarthrosis. No
prevertebral soft tissue swelling. Moderate calcific atherosclerosis
the RIGHT carotid bulb. Large C4-5 ventral osteophyte deforms the
posterior hypopharynx. Irregular scarring of bilateral lung apices.
IMPRESSION: CT HEAD: Moderate RIGHT frontal scalp hematoma.  No skull fracture.

No acute intracranial process, negative CT head for age.

CT CERVICAL SPINE: No acute cervical spine fracture. Grade 1 C6-7
anterolisthesis on degenerative basis.

## 2015-11-06 IMAGING — CR DG KNEE COMPLETE 4+V*R*
1 series · 4 of 4 positions shown · non-contrast
Comparison: None.

CLINICAL DATA: Fall with right knee abrasions.

EXAM:
RIGHT KNEE - COMPLETE 4+ VIEW

[Series 1: ap · 0.17mm/px · 4 of 4 slices shown]
[im 1/4]
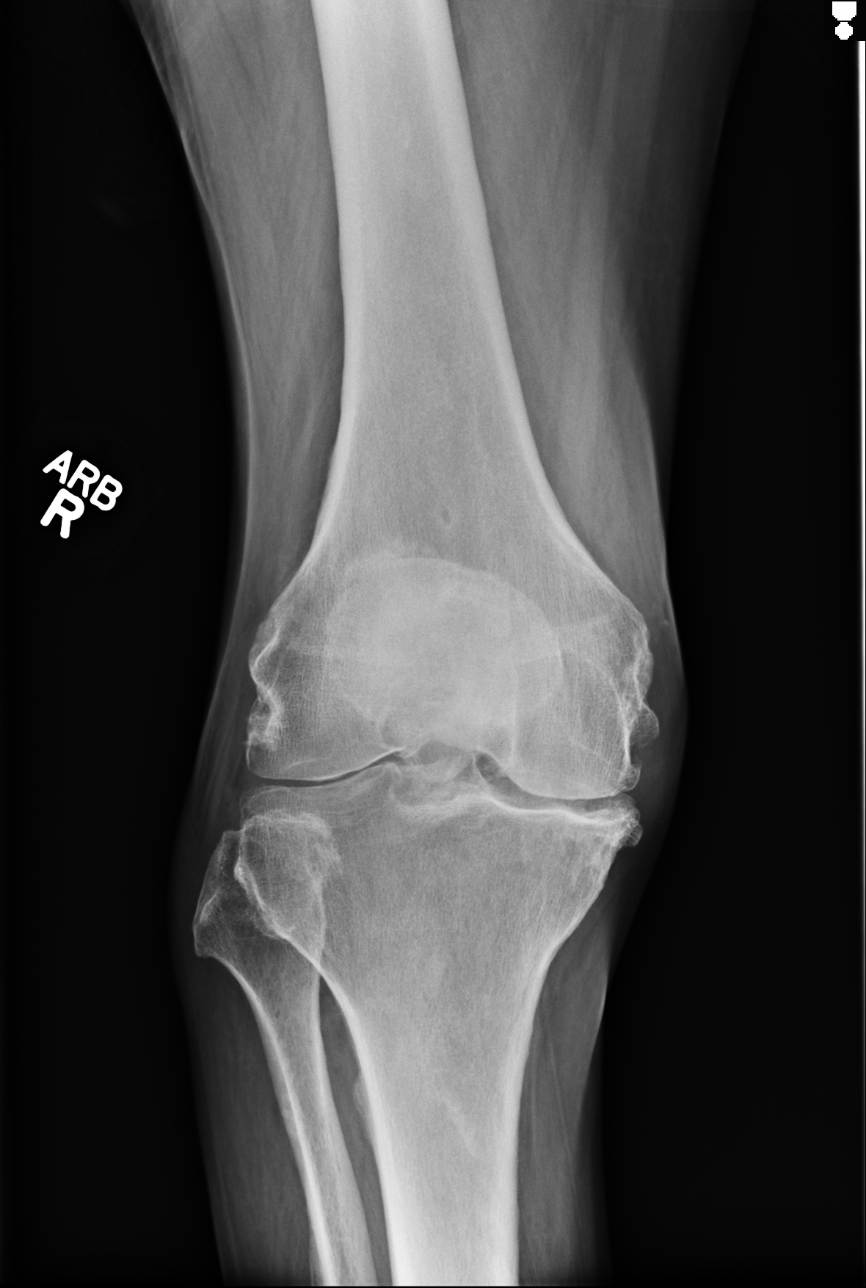
[im 2/4]
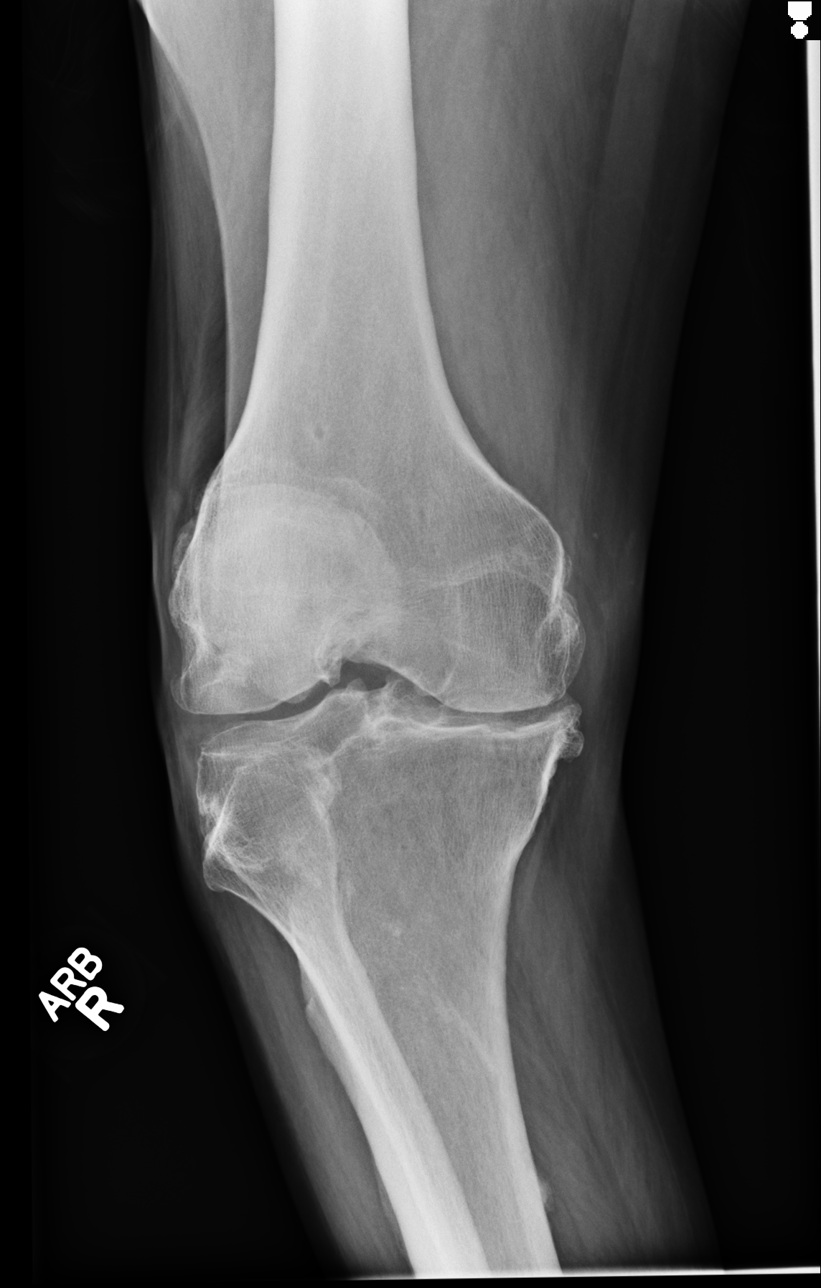
[im 3/4]
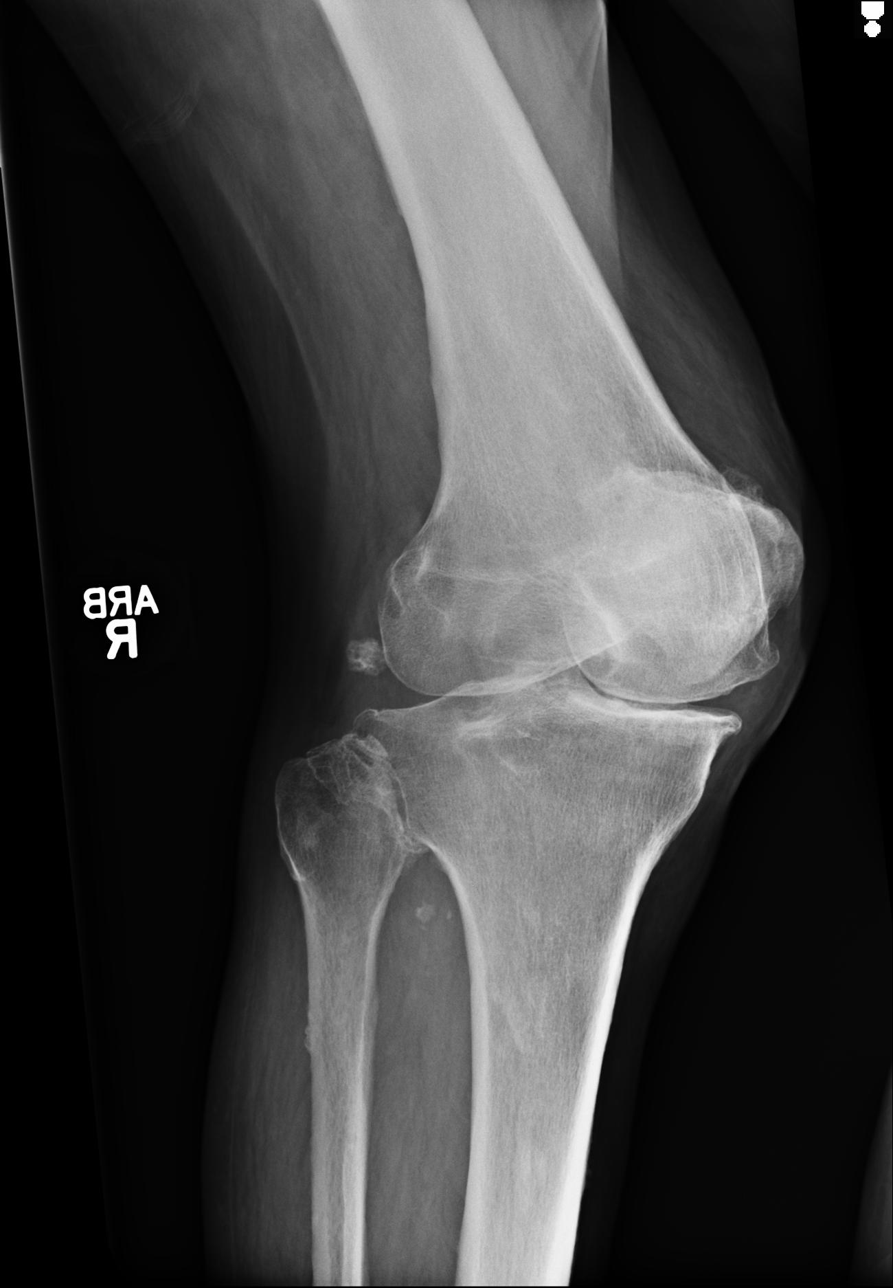
[im 4/4]
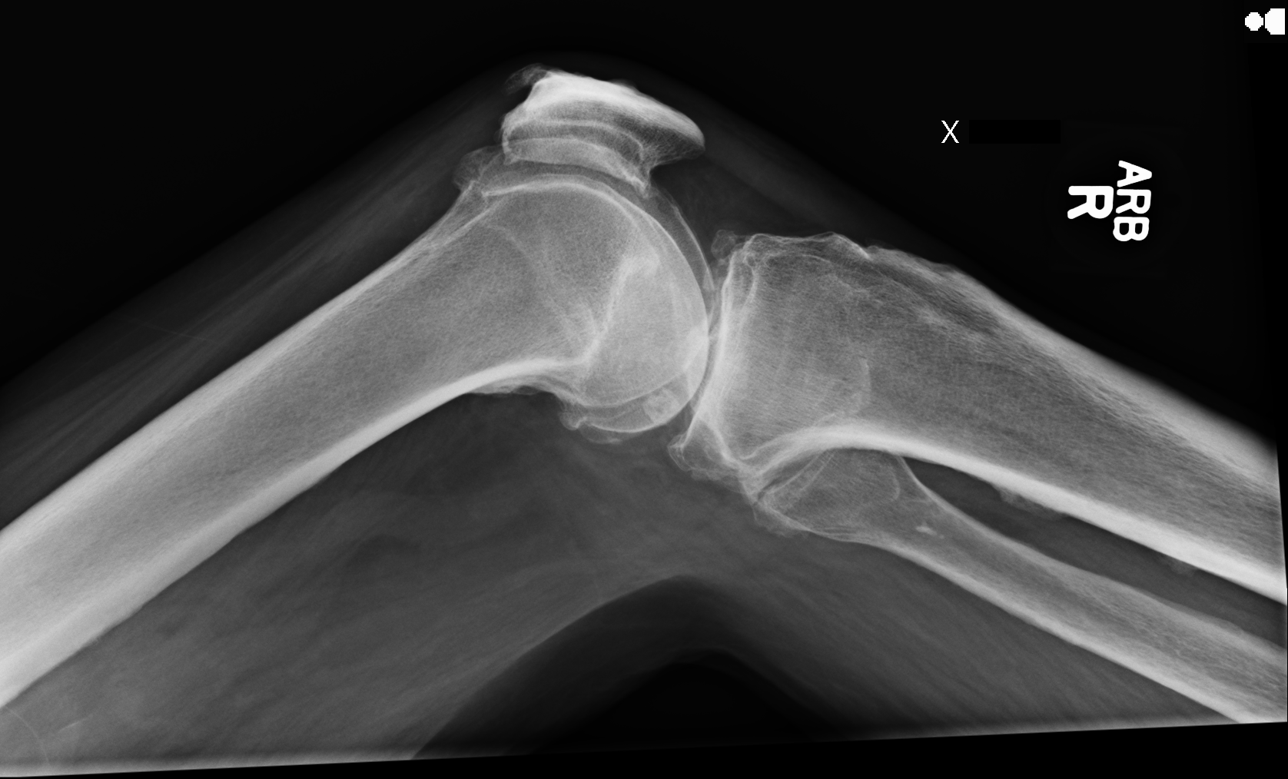

[4 of 4 positions shown; findings below may reference images not displayed]

FINDINGS: No fracture, dislocation or suspicious focal osseous lesion. No
joint effusion. Moderate tricompartmental osteoarthritis, most
prominent in the medial compartment. Moderate superior right
patellar enthesophyte. Vascular calcifications are noted in the
posterior soft tissues.
IMPRESSION: 1. No fracture, joint effusion or malalignment in the right knee.
2. Moderate tricompartmental right knee osteoarthritis.
